# Patient Record
Sex: Female | Born: 1975 | Race: White | Hispanic: No | Marital: Married | Smoking: Never smoker
Health system: Southern US, Community
[De-identification: ages and names within clinical notes are randomized; demographics above are authoritative.]

## PROBLEM LIST (undated history)

## (undated) DIAGNOSIS — F32A Depression, unspecified: Secondary | ICD-10-CM

## (undated) DIAGNOSIS — F329 Major depressive disorder, single episode, unspecified: Secondary | ICD-10-CM

## (undated) DIAGNOSIS — Z0000 Encounter for general adult medical examination without abnormal findings: Principal | ICD-10-CM

## (undated) DIAGNOSIS — Z8619 Personal history of other infectious and parasitic diseases: Secondary | ICD-10-CM

## (undated) DIAGNOSIS — E559 Vitamin D deficiency, unspecified: Secondary | ICD-10-CM

## (undated) DIAGNOSIS — R51 Headache: Secondary | ICD-10-CM

## (undated) DIAGNOSIS — F419 Anxiety disorder, unspecified: Secondary | ICD-10-CM

## (undated) DIAGNOSIS — E039 Hypothyroidism, unspecified: Secondary | ICD-10-CM

## (undated) DIAGNOSIS — E049 Nontoxic goiter, unspecified: Secondary | ICD-10-CM

## (undated) DIAGNOSIS — B343 Parvovirus infection, unspecified: Secondary | ICD-10-CM

## (undated) DIAGNOSIS — K219 Gastro-esophageal reflux disease without esophagitis: Secondary | ICD-10-CM

## (undated) DIAGNOSIS — R251 Tremor, unspecified: Secondary | ICD-10-CM

## (undated) HISTORY — DX: Personal history of other infectious and parasitic diseases: Z86.19

## (undated) HISTORY — DX: Headache: R51

## (undated) HISTORY — DX: Depression, unspecified: F32.A

## (undated) HISTORY — DX: Tremor, unspecified: R25.1

## (undated) HISTORY — DX: Parvovirus infection, unspecified: B34.3

## (undated) HISTORY — DX: Nontoxic goiter, unspecified: E04.9

## (undated) HISTORY — DX: Major depressive disorder, single episode, unspecified: F32.9

## (undated) HISTORY — DX: Vitamin D deficiency, unspecified: E55.9

## (undated) HISTORY — DX: Hypothyroidism, unspecified: E03.9

## (undated) HISTORY — DX: Gastro-esophageal reflux disease without esophagitis: K21.9

## (undated) HISTORY — DX: Encounter for general adult medical examination without abnormal findings: Z00.00

---

## 2002-12-10 ENCOUNTER — Other Ambulatory Visit: Admission: RE | Admit: 2002-12-10 | Discharge: 2002-12-10 | Payer: Self-pay | Admitting: Obstetrics and Gynecology

## 2003-07-10 ENCOUNTER — Inpatient Hospital Stay (HOSPITAL_COMMUNITY): Admission: AD | Admit: 2003-07-10 | Discharge: 2003-07-14 | Payer: Self-pay | Admitting: Obstetrics and Gynecology

## 2003-07-10 ENCOUNTER — Inpatient Hospital Stay (HOSPITAL_COMMUNITY): Admission: AD | Admit: 2003-07-10 | Discharge: 2003-07-10 | Payer: Self-pay | Admitting: Obstetrics and Gynecology

## 2004-09-17 ENCOUNTER — Other Ambulatory Visit: Admission: RE | Admit: 2004-09-17 | Discharge: 2004-09-17 | Payer: Self-pay | Admitting: Obstetrics and Gynecology

## 2005-02-27 ENCOUNTER — Inpatient Hospital Stay (HOSPITAL_COMMUNITY): Admission: AD | Admit: 2005-02-27 | Discharge: 2005-02-27 | Payer: Self-pay | Admitting: Obstetrics & Gynecology

## 2005-06-03 ENCOUNTER — Inpatient Hospital Stay (HOSPITAL_COMMUNITY): Admission: RE | Admit: 2005-06-03 | Discharge: 2005-06-06 | Payer: Self-pay | Admitting: Obstetrics and Gynecology

## 2008-09-24 ENCOUNTER — Encounter (INDEPENDENT_AMBULATORY_CARE_PROVIDER_SITE_OTHER): Payer: Self-pay | Admitting: Obstetrics & Gynecology

## 2008-09-24 ENCOUNTER — Inpatient Hospital Stay (HOSPITAL_COMMUNITY): Admission: RE | Admit: 2008-09-24 | Discharge: 2008-09-27 | Payer: Self-pay | Admitting: Obstetrics & Gynecology

## 2009-10-01 ENCOUNTER — Encounter: Admission: RE | Admit: 2009-10-01 | Discharge: 2009-10-01 | Payer: Self-pay | Admitting: Family Medicine

## 2010-12-02 DIAGNOSIS — Z8619 Personal history of other infectious and parasitic diseases: Secondary | ICD-10-CM

## 2010-12-02 HISTORY — DX: Personal history of other infectious and parasitic diseases: Z86.19

## 2011-02-15 NOTE — Op Note (Signed)
NAMEGABRYELLA, [name]               ACCOUNT NO.:  [id number]   MEDICAL RECORD NO.:  [id number]          PATIENT TYPE:  INP   LOCATION:  9108                          FACILITY:  [facility]   PHYSICIAN:  Gerrit Friends. Aldona Bar, M.D.   DATE OF BIRTH:  12-20-1975   DATE OF PROCEDURE:  09/24/2008  DATE OF DISCHARGE:                               OPERATIVE REPORT   PREOPERATIVE DIAGNOSES:  39-week intrauterine pregnancy, previous  section x2, desire for permanent elective sterilization.   POSTOPERATIVE DIAGNOSES:  39-week intrauterine pregnancy, previous  section x2, desire for permanent elective sterilization plus delivery of  7 pounds 10 ounces female infant, Apgars 9 and 9 and pathology pending on  segments of each fallopian tube.   PROCEDURE:  Repeat low-transverse cesarean section and bilateral tubal  sterilization procedure.   SURGEON:  Gerrit Friends. Aldona Bar, MD   ASSISTANT:  Luvenia Redden, MD   ANESTHESIA:  Spinal - Dr. Pamalee Leyden.   HISTORY:  This 35 year old gravida 3, para 2 presents at 26 weeks'  gestation having had 2 previous cesarean sections.  She is now being  taken to the operating room for a repeat cesarean section and requested  permanent elective sterilization procedure as well.  She is aware that  such procedure is meant to be 100% permanent, but unfortunately it is  not 100% perfect as subsequent pregnancy can result.   PROCEDURE:  The patient was taken to the operating where after  satisfactory induction of spinal anesthetic by Dr. Pamalee Leyden, she was  prepped and draped having placed in the supine position slightly tilted  left.  Foley catheter was inserted for the prep.   Once the patient was adequately draped and good anesthetic levels were  documented, procedure was begun.   A Pfannenstiel incision was made through the old incision site and  dissected down sharply to and through the fascia in a low-transverse  fashion with hemostasis created at each layer.  Subfascial space was  created inferiorly and superiorly, muscles separated in the midline.  Peritoneum identified and entered appropriately with care taken to avoid  the bowel superiorly and the bladder inferiorly.  At this time, the  vesicouterine peritoneum was incised in a low-transverse fashion and  pushed off the lower uterine segment with ease.  Sharp incision into the  lower segment in the low transverse fashion was then carried out with  Metzenbaum scissors and extended laterally with the fingers.  With the  aid of the vacuum extractor, delivery of a viable female infant, which  cried spontaneously at once was carried out in vertex position without  difficulty.  There was a nuchal cord x1.   After the cord was clamped and cut, the infant was passed off to the  awaiting team and subsequently taken to nursery in good condition.  Apgar were assigned at 9 and 9 and subsequent weight was found to be 7  pounds 10 ounces.   After cord bloods were collected, the placenta was delivered intact.  The uterus was then exteriorized, rendered free of any remaining  products of conception, and good uterine contractility was  afforded, was  slowly given intravenous Pitocin and manual stimulation.  At this time,  the uterine incision was closed with a single layer of #1 Vicryl in a  running locking fashion and a figure-of-eight of #1 Vicryl was applied  for additional hemostasis.  Uterine incision was noted to be dry.  The  uterus at this time was well contracted.  Attention was turned to the  fallopian tube, which appeared normal as did both ovaries.  A Pomeroy  tubal sterilization procedure was carried out in usual fashion.  A  knuckle of the tube was created with a Babcock and a single tie of #1  plain was tied down about the knuckle, the knuckle was excised and sent  to Pathology.  This was carried out on both the right and left fallopian  tubes.  Both sites of tubal ligation were hemostatic and again the  uterine  incision was noted to be dry.  The uterus was replaced into the  abdominal incision after the abdomen lavaged of all free blood and clot  and no foreign bodies at this time were noted to be remaining in the  abdominal cavity and all counts were correct.  Again irrigation was  carried out and at this time closure of the abdomen was begun in layers.  The abdominal peritoneum was closed with 0-Vicryl in a running fashion.  Muscle secured with same.  Assured good subfascial hemostasis.  The  fascia was then reapproximated using 0-Vicryl from angle to midline  bilaterally.  Subcutaneous tissues were hemostatic and staples were then  used to close the skin.  A sterile pressure dressing was applied, and  the patient at this time was transported to recovery room in  satisfactory addition having tolerated the procedure well.  At the  conclusion of the procedure, both mother and baby were doing well in  their respective recovery areas.   In summary, this patient presented for repeat cesarean section and  desired for elective sterilization procedure.  Procedure was carried out  without difficulty and as mentioned, at conclusion of procedure both  mother and baby were doing well.      Gerrit Friends. Aldona Bar, M.D.  Electronically Signed     RMW/MEDQ  D:  09/24/2008  T:  09/24/2008  Job:  [id number]

## 2011-02-18 NOTE — Op Note (Signed)
[name], [name]               ACCOUNT NO.:  [id number]   MEDICAL RECORD NO.:  [id number]          PATIENT TYPE:  INP   LOCATION:  9109                          FACILITY:  [facility]   PHYSICIAN:  Miguel Aschoff, M.D.       DATE OF BIRTH:  [birth year]   DATE OF PROCEDURE:  06/03/2005  DATE OF DISCHARGE:                                 OPERATIVE REPORT   PREOPERATIVE DIAGNOSIS:  Intrauterine pregnancy at 39 weeks, for elective  repeat cesarean section.   POSTOPERATIVE DIAGNOSIS:  Intrauterine pregnancy at 39 weeks, for elective  repeat cesarean section.   PROCEDURE:  Repeat low flap transverse cesarean section.   SURGEON:  Miguel Aschoff, M.D.   ASSISTANT:  Carrington Clamp, M.D.   ANESTHESIA:  Spinal.   COMPLICATIONS:  None.   JUSTIFICATION:  The patient is a 35 year old white female, gravida 2, para 1-  0-0-1, with estimated date of confinement of June 09, 2005.  The patient  had previous cesarean section in 2004 for arrest of labor.  She presents now  to undergo to undergo elective repeat cesarean section with her being at  term.  Risks, benefits of the procedure were discussed with the patient.   PROCEDURE:  The patient was taken to the operating room, placed in sitting  position, and spinal anesthesia was administered without difficulty.  She  was then placed in the supine position, prepped and draped in the usual  sterile fashion.  She was deviated slightly to the left.  A Foley catheter  was inserted.  At this point a Pfannenstiel incision was made, extending  down through the subcutaneous tissue with bleeding points being clamped and  coagulated as they were encountered.  The fascia was then identified,  incised transversely.  It was then separated from the underlying rectus  muscles.  The rectus muscles were divided in the midline.  The peritoneum  was then found and entered, carefully underlying structures.  The bladder flap was then created and protected with the bladder  blade.  At  this point, an elliptical transverse incision was made into lower uterine  segment.  The amniotic cavity was entered, clear fluid was obtained, and  with the assistance of a vacuum extractor the patient was delivery of a  viable female infant, Apgar 8 at one minute and 9 at five minutes from a  vertex LOA position.  The baby weighed 8 pounds 7 ounces.  At this point  cord bloods were obtained for appropriate studies.  The placenta was then delivered.  The uterus was then evacuated of any  remaining products of conception.  The angles of the uterine incision were  ligated using figure-of-eight sutures of #1 Vicryl.  Then the uterus was closed in layers.  The first layer was a running  interlocking suture of #1 Vicryl, followed by an imbricating suture of #1  Vicryl.  The bladder flap was reapproximated using running continuous 2-0  Vicryl suture.  At this point lap counts were taken and found to be correct.  There was excellent hemostasis, and then the abdomen was closed.  The  parietal peritoneum was closed using running continuous 0 Vicryl suture.  Rectus muscles were reapproximated using running continuous 0 Vicryl suture.  The fascia was closed using two sutures of 0 Vicryl  starting at the lateral fascial angles and meeting in the midline.  Subcutaneous tissue and skin were closed using staples.  The estimated blood  loss for the procedure was approximately 500 mL.  The patient tolerated the  procedure well and went to the recovery room in satisfactory condition.      Miguel Aschoff, M.D.  Electronically Signed     AR/MEDQ  D:  06/03/2005  T:  06/03/2005  Job:  [id number]

## 2011-02-18 NOTE — Op Note (Signed)
[name], [name]                         ACCOUNT NO.:  [id number]   MEDICAL RECORD NO.:  [id number]                   PATIENT TYPE:  INP   LOCATION:  9165                                 [facility]ACILITY:  [facility]   PHYSICIAN:  Gerrit Friends. Aldona Bar, M.D.                DATE OF BIRTH:  Jun 06, 1976   DATE OF PROCEDURE:  07/11/2003  DATE OF DISCHARGE:                                 OPERATIVE REPORT   PREOPERATIVE DIAGNOSES:  1. Term pregnancy.  2. Suspected macrosomia.  3. Unengaged vertex in a primigravida at term in labor.   POSTOPERATIVE DIAGNOSES:  1. Term pregnancy.  2. Suspected macrosomia.  3. Unengaged vertex in a primigravida at term in labor.  4. Delivery of a 9 pound female infant, Apgars 9 and 9.   PROCEDURE:  Primary low transverse cesarean section.   SURGEON:  Gerrit Friends. Aldona Bar, M.D.   ANESTHESIA:  Spinal, Quillian Quince, M.D.   HISTORY:  This 35 year old primigravida at term was admitted on the evening  of October 7 in early labor after several visits to the hospital for  suspected labor.  On my evaluation on the morning of October 0, 2004, the  fundus measured 43 cm, fetal heart rate was reactive, cervix was 2 cm  dilated, 90% effaced, with the vertex at -3 and higher, ballottable vertex.  Membranes were intact.   To me the patient represented an unengaged vertex at term in a primigravida,  probably secondary to suspected macrosomia.  A discussion was carried out  with the patient and her husband as to method of delivery, cesarean section  versus anticipated vaginal delivery.  Risks and benefits were discussed and  a decision was made to proceed with a primary low transverse cesarean  section because of the possibility of a macrosomic baby and the potential of  a very difficult vaginal delivery should the patient progress to that point.  She was taken to the operating room now for said procedure.   Once in the operating room a spinal anesthetic was carried out by Dr.  Arby Barrette without difficulty, and thereafter the patient was prepped and  draped and a Foley catheter was inserted as part of the prep.   After the patient was adequately draped and good anesthetic levels were  documented, the procedure was begun.  A Pfannenstiel incision was made, with  minimal difficulty dissected down sharply to and through the fascia in a low  transverse fashion with hemostasis created at each layer.  The subfascial  space was created inferiorly and superiorly and muscles separated in the  midline and the peritoneum identified and entered appropriately with care  taken to avoid the bowel superiorly and the bladder inferiorly.  At this  time the vesicouterine peritoneum was identified, incised in a low  transverse fashion, pushed off the lower uterine segment with ease, and then  sharp incision into the uterus with the  Metzenbaum scissors was made and  extended laterally with fingers.  Amniotomy was produced, clear fluid.  Thereafter with minimal difficulty a viable female infant, which subsequently  was found to weigh 9 pounds, was delivered from a vertex position.  After  the cord was clamped and cut, the infant was passed off to the awaiting team  headed up by Dr. Alison Murray, and subsequently taken to the nursery in good  condition.  Apgars were noted to be 9 and 9.   After the placenta was delivered intact (cord bloods previously collected),  the uterus was exteriorized and rendered free of any remaining products of  conception.  Good uterine contractility was afforded with slowly-given  intravenous Pitocin and manual stimulation.  Closure of the uterine incision  at this time was carried out using #1 Vicryl in a running locking fashion.  This was reinforced with several figure-of-eight #1 Vicryls.  With good  uterine incision hemostasis noted, normal tubes and ovaries noted, a well-  contracted uterus observed, the uterus was replaced into the abdomen after  the abdomen  was lavaged of all free blood and clot.  All counts were noted  to be correct at this time and no foreign bodies were noted to remain in the  abdominal cavity.  Closure of the abdomen at this time was carried out in  layers.  The abdominal cavity was closed with 0 Vicryl in a running fashion  and the muscle secured with the same.  Assured of good subfascial  hemostasis, the fascia was then reapproximated using 0 Vicryl from angle to  midline bilaterally.  The subcutaneous tissue was then rendered hemostatic  and staples were then used to close the skin.  A sterile pressure dressing  was applied.  At this time the patient was transported to the recovery room,  having tolerated the procedure well.  Estimated blood loss 500 mL.  All  counts correct x2.   In summary, this patient was suspected to be carrying a baby that was large  and also represented an unengaged vertex at term in a primigravida at term  in early labor.  After discussion a decision was made to proceed with a  primary low transverse cesarean section.  The patient was delivered of a 9  pound female infant with Apgars of 9 and 9.  At the conclusion of the  procedure both mother and baby were doing well in their respective recovery  areas.                                                Gerrit Friends. Aldona Bar, M.D.    RMW/MEDQ  D:  07/11/2003  T:  07/11/2003  Job:  [id number]

## 2011-02-18 NOTE — Discharge Summary (Signed)
[name], [name]               ACCOUNT NO.:  [id number]   MEDICAL RECORD NO.:  [id number]          PATIENT TYPE:  INP   LOCATION:  9109                          FACILITY:  [facility]   PHYSICIAN:  Randye Lobo, M.D.   DATE OF BIRTH:  1976/06/09   DATE OF ADMISSION:  06/03/2005  DATE OF DISCHARGE:  06/06/2005                                 DISCHARGE SUMMARY   FINAL DIAGNOSES:  1.  Intrauterine pregnancy at term.  2.  History of previous cesarean section.  3.  Elective repeat cesarean section.   PROCEDURE:  Repeat low flap transverse cesarean section.   SURGEON:  Dr. Miguel Aschoff.   ASSISTANT:  Dr. Carrington Clamp.   COMPLICATIONS:  None.   HISTORY OF PRESENT ILLNESS:  This 35 year old G2, P 1-0-0-1 presents at term  for repeat cesarean section. The patient had a previous cesarean section in  2004 secondary to arrest of labor, and discussion was held with the patient  throughout her pregnancy, and she presents to undergo cesarean section at  term. The patient's antepartum course at this point had been uncomplicated.  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 She had a  negative group B strep culture obtained in the office at 36 weeks.  Otherwise, as I said, antepartum course was benign.   HOSPITAL COURSE:  She was taken to the operating room on June 03, 2005  by Dr. Miguel Aschoff where a repeat low flap transverse cesarean section was  performed with the delivery of an 8 pound 7 ounce female infant with Apgars  of 9/9.  Delivery was without complications. The patient's postoperative  course was benign without significant fevers. She was felt ready for  discharge on postoperative day #3.   DISCHARGE DIET:  She was sent home on a regular diet.   ACTIVITY:  She was told to decrease her activities.   DISCHARGE MEDICATIONS:  1.  She was told to continue her prenatal vitamins.  2.  She was to take an iron supplement daily.  3.  She given Darvocet N 100  one every 4 hours as needed for pain.  4.  She was told she could also use ibuprofen up to 600 milligrams every 6      hours as needed for pain.   FOLLOW UP:  She was to follow up in the office in 4 weeks.   LABORATORIES ON DISCHARGE:  The patient had a hemoglobin of 10.6, white  blood cell count of 10.4 and platelets of 117,000.      Leilani Able, P.A.-C.      Randye Lobo, M.D.  Electronically Signed    MB/MEDQ  D:  07/18/2005  T:  07/18/2005  Job:  [id number]

## 2011-02-18 NOTE — Discharge Summary (Signed)
[name], LECKER               ACCOUNT NO.:  [id number]   MEDICAL RECORD NO.:  [id number]          PATIENT TYPE:  INP   LOCATION:  9128                          [facility]ACILITY[facility]   PHYSICIAN:  Ilda Mori, M.D.   DATE OF BIRTH:  1976-01-27   DATE OF ADMISSION:  09/24/2008  DATE OF DISCHARGE:  09/27/2008                               DISCHARGE SUMMARY   FINAL DIAGNOSES:  Intrauterine pregnancy at 67 weeks' gestation, history  of previous cesarean sections x2, desires for permanent sterilization.   PROCEDURE:  Repeat low transverse cesarean section, bilateral tubal  sterilization procedure.   SURGEON:  Gerrit Friends. Aldona Bar, MD   ASSISTANT:  Luvenia Redden, MD   COMPLICATIONS:  None.   This 35 year old, G3, P2, presents at 1 weeks' gestation for repeat  cesarean section.  The patient had 2 prior cesarean sections and also  desires permanent sterilization to be performed after this third  cesarean section.  The patient's antepartum course up to this point had  been uncomplicated.  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  Otherwise, she had a negative group B strep  and an uncomplicated antepartum course.  The patient was taken to the  operating room on September 24, 2008, by Dr. Annamaria Helling where a repeat  low transverse cesarean section was performed with the delivery of a 7  pound 10 ounce female infant with Apgars of 9 and 9.  Delivery went  without complications.  At this point, the bilateral tubal ligation was  performed without complications as well.  The patient's postoperative  course and was benign without any significant fevers.  The patient was  felt ready for discharge on postoperative day #3.  She was sent home on  a regular diet, told to decrease activities, told to continue her  prenatal vitamins, was given a prescription for Darvocet-N 100 #30 one  to two every 4-6 hours as needed for pain, told she could use ibuprofen  up to 600 mg  every 6 hours as needed for pain, was to follow up in our  office in 4 weeks.  Instructions and precautions were reviewed with the  patient.   LABORATORY ON DISCHARGE:  The patient had a hemoglobin of 9.5, a white  blood cell count of 10.2, and platelets of 99,000.  Leilani Able, P.A.-C.      Ilda Mori, M.D.  Electronically Signed   MB/MEDQ  D:  10/21/2008  T:  10/21/2008  Job:  [id number]

## 2011-02-18 NOTE — Discharge Summary (Signed)
   [name], [name]                         ACCOUNT NO.:  [id number]   MEDICAL RECORD NO.:  [id number]                   PATIENT TYPE:  INP   LOCATION:  9116                                 [facility]ILITY[facility]   PHYSICIAN:  Ilda Mori, M.D.                DATE OF BIRTH:  April 26, 1976   DATE OF ADMISSION:  07/10/2003  DATE OF DISCHARGE:  07/14/2003                                 DISCHARGE SUMMARY   FINAL DIAGNOSES:  Term pregnancy delivered, suspected macrosomia, unengaged  vertex in a primigravida at term in labor.   SECONDARY DIAGNOSES:  None.   PROCEDURE:  Primary low transverse cesarean section.   COMPLICATIONS:  None.   CONDITION ON DISCHARGE:  Improved.   SUMMARY:  This is a 35 year old primigravida who was admitted on the evening  of July 10, 2003 in early labor after several visits to the hospital for  suspected labor.  She was evaluated by Dr. Aldona Bar who felt her cervix was 2 cm  dilated, 90% effaced, with a vertex at -3 station and ballottable, membranes  were intact.  Dr. Mechele Collin impression was in this clinical setting that  macrosomia was probably present and he discussed with the patient and her  husband the risks and benefits of trial of labor versus primary low  transverse cesarean section.  Decision was reached to proceed with a  cesarean section.  The patient was brought to the operating room by Dr. Aldona Bar  where a primary low transverse cesarean section was performed without  complication with delivery of a 9-pound female infant, Apgar scores 9 and 9.  The patient's postoperative course was benign without significant fever or  anemia.  On the third postoperative day the patient was felt to be ready for  discharge, she was afebrile, ambulating well and voiding without difficulty.  She was discharged on a regular diet, told to limit her activity, she was  given Tylox 30 tablets to take one to two every 4 hours for pain and asked  to take a prenatal vitamin.  She was  also asked to return to the office in 4  weeks for her followup evaluation.   LABORATORY DATA:  Her admission hemoglobin was 13.4 with a white count of  15.3.  On discharge her hemoglobin was 10.2 with a white count of 12.5.  Her  RPR was nonreactive.                                               Ilda Mori, M.D.    RK/MEDQ  D:  08/01/2003  T:  08/01/2003  Job:  [id number]

## 2011-03-04 DIAGNOSIS — B343 Parvovirus infection, unspecified: Secondary | ICD-10-CM

## 2011-03-04 HISTORY — DX: Parvovirus infection, unspecified: B34.3

## 2011-07-08 LAB — CBC
HCT: 28.2 % — ABNORMAL LOW (ref 36.0–46.0)
Hemoglobin: 9.5 g/dL — ABNORMAL LOW (ref 12.0–15.0)
MCV: 79.2 fL (ref 78.0–100.0)
Platelets: 151 10*3/uL (ref 150–400)
RBC: 4.58 MIL/uL (ref 3.87–5.11)
RDW: 15 % (ref 11.5–15.5)
WBC: 10.2 10*3/uL (ref 4.0–10.5)
WBC: 12.9 10*3/uL — ABNORMAL HIGH (ref 4.0–10.5)

## 2011-07-08 LAB — RPR: RPR Ser Ql: NONREACTIVE

## 2012-03-08 ENCOUNTER — Ambulatory Visit (INDEPENDENT_AMBULATORY_CARE_PROVIDER_SITE_OTHER): Payer: BC Managed Care – PPO | Admitting: Internal Medicine

## 2012-03-08 ENCOUNTER — Encounter: Payer: Self-pay | Admitting: Internal Medicine

## 2012-03-08 VITALS — BP 108/72 | HR 94 | Temp 98.3°F | Resp 18 | Ht 63.25 in | Wt 161.0 lb

## 2012-03-08 DIAGNOSIS — F341 Dysthymic disorder: Secondary | ICD-10-CM

## 2012-03-08 DIAGNOSIS — F418 Other specified anxiety disorders: Secondary | ICD-10-CM

## 2012-03-08 MED ORDER — BUPROPION HCL ER (XL) 150 MG PO TB24: 150.0000 mg | ORAL_TABLET | Freq: Every day | ORAL | Status: DC

## 2012-03-08 MED ORDER — ALPRAZOLAM 0.25 MG PO TABS: 0.2500 mg | ORAL_TABLET | Freq: Three times a day (TID) | ORAL | Status: DC | PRN

## 2012-03-11 DIAGNOSIS — F418 Other specified anxiety disorders: Secondary | ICD-10-CM | POA: Insufficient documentation

## 2012-03-11 NOTE — Assessment & Plan Note (Signed)
Change ssri to wellbutrin. rf xanax for prn sparing use. Understands potential for addiction/tolerance.

## 2012-03-11 NOTE — Progress Notes (Signed)
  Subjective:    Patient ID: [name], female    DOB: 1976/06/06, 36 y.o.   MRN: [id number]  HPI Pt presents to clinic for evaluation of depression. Suffers from mild depression. Has attempted both lexapro and prozac complicated by weight gain. Does use sparing prn xanax for mild anxiety. States labs utd including tsh. No alleviating or exacerbating factors. No other complaints.   Past Medical History  Diagnosis Date  . Depression     no counseling  . History of shingles 12/2010  . Parvovirus infection June 2012   Past Surgical History  Procedure Date  . Cesarean section 04, 06, 09    reports that she has never smoked. She has never used smokeless tobacco. She reports that she drinks alcohol. She reports that she does not use illicit drugs. family history includes Arthritis in her mother; Breast cancer in her maternal aunt; Colon cancer in her maternal aunt; Diabetes in her father; Hyperlipidemia in her father; Hypertension in her father; and Uterine cancer in her maternal aunt and maternal grandmother. No Known Allergies   Review of Systems  Psychiatric/Behavioral: Positive for dysphoric mood. Negative for suicidal ideas and agitation. The patient is nervous/anxious.   All other systems reviewed and are negative.       Objective:   Physical Exam  Nursing note and vitals reviewed. Constitutional: She appears well-developed and well-nourished. No distress.  HENT:  Head: Normocephalic and atraumatic.  Right Ear: External ear normal.  Left Ear: External ear normal.  Eyes: Conjunctivae and EOM are normal. Pupils are equal, round, and reactive to light. No scleral icterus.  Neck: Neck supple. No thyromegaly present.  Cardiovascular: Normal rate, regular rhythm and normal heart sounds.  Exam reveals no gallop and no friction rub.   No murmur heard. Pulmonary/Chest: Effort normal and breath sounds normal. No respiratory distress. She has no wheezes. She has no rales.    Lymphadenopathy:    She has no cervical adenopathy.  Neurological: She is alert.  Skin: Skin is warm and dry. She is not diaphoretic.  Psychiatric: She has a normal mood and affect.          Assessment & Plan:

## 2012-03-16 ENCOUNTER — Telehealth: Payer: Self-pay | Admitting: Internal Medicine

## 2012-03-16 NOTE — Telephone Encounter (Signed)
Received medical records from [facility] ° °P: [phone] °F: [phone] °

## 2012-04-26 ENCOUNTER — Ambulatory Visit (INDEPENDENT_AMBULATORY_CARE_PROVIDER_SITE_OTHER): Payer: BC Managed Care – PPO | Admitting: Internal Medicine

## 2012-04-26 ENCOUNTER — Encounter: Payer: Self-pay | Admitting: Internal Medicine

## 2012-04-26 VITALS — BP 118/72 | HR 87 | Temp 98.3°F | Resp 14 | Wt 155.5 lb

## 2012-04-26 DIAGNOSIS — R110 Nausea: Secondary | ICD-10-CM | POA: Insufficient documentation

## 2012-04-26 DIAGNOSIS — F418 Other specified anxiety disorders: Secondary | ICD-10-CM

## 2012-04-26 DIAGNOSIS — F341 Dysthymic disorder: Secondary | ICD-10-CM

## 2012-04-26 LAB — HEPATIC FUNCTION PANEL
AST: 12 U/L (ref 0–37)
Bilirubin, Direct: 0.1 mg/dL (ref 0.0–0.3)
Indirect Bilirubin: 0.4 mg/dL (ref 0.0–0.9)
Total Bilirubin: 0.5 mg/dL (ref 0.3–1.2)

## 2012-04-26 NOTE — Assessment & Plan Note (Signed)
Obtain lft and schedule abd [place].

## 2012-04-26 NOTE — Assessment & Plan Note (Signed)
Contolled. Continue wellbutrin.

## 2012-04-26 NOTE — Progress Notes (Signed)
  Subjective:    Patient ID: [name], female    DOB: [birth year], 36 y.o.   MRN: [id number]  HPI Pt presents to clinic for follow up of depression/anxiety. Previous treatment with prozac and lexapro leg to weight gain. Feels improved mood with wellbutrin. Notes only rare brief dizziness that is not burdensome. Has not required any xanax since beginning wellbutrin. Notes several week h/o intermittent nausea after eating fatty food. Denies emesis or abdominal pain. Now avoiding fatty food and sx's have resolved. Concerned over possible gallbladder etiology.   Past Medical History  Diagnosis Date  . Depression     no counseling  . History of shingles 12/2010  . Parvovirus infection June 2012   Past Surgical History  Procedure Date  . Cesarean section 04, 06, 09    reports that she has never smoked. She has never used smokeless tobacco. She reports that she drinks alcohol. She reports that she does not use illicit drugs. family history includes Arthritis in her mother; Breast cancer in her maternal aunt; Colon cancer in her maternal aunt; Diabetes in her father; Hyperlipidemia in her father; Hypertension in her father; and Uterine cancer in her maternal aunt and maternal grandmother. No Known Allergies   Review of Systems see hpi    Objective:   Physical Exam  Nursing note and vitals reviewed. Constitutional: She appears well-developed and well-nourished. No distress.  HENT:  Head: Normocephalic and atraumatic.  Neurological: She is alert.  Skin: She is not diaphoretic.  Psychiatric: She has a normal mood and affect. Her behavior is normal.          Assessment & Plan:

## 2012-04-26 NOTE — Patient Instructions (Signed)
Please schedule your abdominal ultrasound for when you are fasting

## 2012-04-27 ENCOUNTER — Ambulatory Visit (HOSPITAL_BASED_OUTPATIENT_CLINIC_OR_DEPARTMENT_OTHER)
Admission: RE | Admit: 2012-04-27 | Discharge: 2012-04-27 | Disposition: A | Discharge status: Home / Self Care | Payer: BC Managed Care – PPO | Source: Ambulatory Visit | Attending: Internal Medicine | Admitting: Internal Medicine

## 2012-04-27 DIAGNOSIS — R110 Nausea: Secondary | ICD-10-CM

## 2012-04-27 DIAGNOSIS — K8020 Calculus of gallbladder without cholecystitis without obstruction: Secondary | ICD-10-CM | POA: Insufficient documentation

## 2012-05-03 ENCOUNTER — Telehealth: Payer: Self-pay | Admitting: Internal Medicine

## 2012-05-03 MED ORDER — BUPROPION HCL ER (XL) 150 MG PO TB24: 150.0000 mg | ORAL_TABLET | Freq: Every day | ORAL | Status: DC

## 2012-05-03 NOTE — Telephone Encounter (Signed)
Replaced 06.06.13 Rx with 10-day supply at local pharmacy while awaiting new mail order Rx for Wellbutrin/[facility] Mail Order to new pharmacy done/[facility]

## 2012-05-03 NOTE — Telephone Encounter (Signed)
She needs her rx for bupropion 150xl mg 1 per day  Needs 30 day to [facility] mail order phone [phone] fax [phone] she also needs a 10 day to [facility] [address] in [place]

## 2012-10-17 ENCOUNTER — Ambulatory Visit (INDEPENDENT_AMBULATORY_CARE_PROVIDER_SITE_OTHER): Payer: BC Managed Care – PPO | Admitting: *Deleted

## 2012-10-17 DIAGNOSIS — Z23 Encounter for immunization: Secondary | ICD-10-CM

## 2012-12-26 ENCOUNTER — Ambulatory Visit (INDEPENDENT_AMBULATORY_CARE_PROVIDER_SITE_OTHER): Payer: BC Managed Care – PPO | Admitting: Family

## 2012-12-26 ENCOUNTER — Encounter: Payer: Self-pay | Admitting: Family

## 2012-12-26 VITALS — BP 118/80 | HR 105 | Temp 98.1°F | Resp 16 | Ht 63.25 in | Wt 163.0 lb

## 2012-12-26 DIAGNOSIS — J029 Acute pharyngitis, unspecified: Secondary | ICD-10-CM

## 2012-12-26 DIAGNOSIS — J069 Acute upper respiratory infection, unspecified: Secondary | ICD-10-CM | POA: Insufficient documentation

## 2012-12-26 DIAGNOSIS — F418 Other specified anxiety disorders: Secondary | ICD-10-CM

## 2012-12-26 DIAGNOSIS — F341 Dysthymic disorder: Secondary | ICD-10-CM

## 2012-12-26 DIAGNOSIS — R110 Nausea: Secondary | ICD-10-CM

## 2012-12-26 LAB — POCT RAPID STREP A (OFFICE): Rapid Strep A Screen: NEGATIVE

## 2012-12-26 MED ORDER — ALPRAZOLAM 0.25 MG PO TABS: 0.2500 mg | ORAL_TABLET | Freq: Three times a day (TID) | ORAL | Status: DC | PRN

## 2012-12-26 NOTE — Progress Notes (Signed)
  Subjective:    Patient ID: [name], female    DOB: 1976-02-09, 37 y.o.   MRN: [id number]  HPI  Reports symptoms started Sunday as a sore throat. Reports associated congestion and laryngitis.  She denies associated fever.  She reports family members have been sick.  Notes some white on her tonsil. She took contact cold last night without improvement in her symptoms.  The night before she took delsym which helped her cough.  Anxiety- reports rare use of alprazolam.  Uses twice a month.  She stopped the Wellbutrin.  Reports that she is doing well off of the wellbutrin.   Epigastric pain- notes that she often wakes up with abdominal pain in the AM. Spoke to her GYN who referred her to a surgeon for evaluation of possible chronic cholecystitis.  Review of Systems    see HPI  Past Medical History  Diagnosis Date  . Depression     no counseling  . History of shingles 12/2010  . Parvovirus infection June 2012    History   Social History  . Marital Status: Married    Spouse Name: N/A    Number of Children: N/A  . Years of Education: N/A   Occupational History  . Not on file.   Social History Main Topics  . Smoking status: Never Smoker   . Smokeless tobacco: Never Used  . Alcohol Use: Yes  . Drug Use: No  . Sexually Active: Not on file   Other Topics Concern  . Not on file   Social History Narrative  . No narrative on file    Past Surgical History  Procedure Laterality Date  . Cesarean section  04, 06, 09    Family History  Problem Relation Age of Onset  . Arthritis Mother   . Colon cancer Maternal Aunt   . Uterine cancer Maternal Aunt   . Breast cancer Maternal Aunt   . Uterine cancer Maternal Grandmother   . Hyperlipidemia Father   . Hypertension Father   . Diabetes Father     No Known Allergies  Current Outpatient Prescriptions on File Prior to Visit  Medication Sig Dispense Refill  . buPROPion (WELLBUTRIN XL) 150 MG 24 hr tablet Take 1 tablet (150  mg total) by mouth daily.  90 tablet  1   No current facility-administered medications on file prior to visit.    BP 118/80  Pulse 105  Temp(Src) 98.1 F (36.7 C) (Oral)  Resp 16  Ht 5' 3.25" (1.607 m)  Wt 163 lb (73.936 kg)  BMI 28.63 kg/m2  SpO2 99%  LMP 12/12/2012    Objective:   Physical Exam  Constitutional: She is oriented to person, place, and time. She appears well-developed and well-nourished. No distress.  HENT:  Head: Normocephalic and atraumatic.  Mouth/Throat: Posterior oropharyngeal erythema present. No oropharyngeal exudate, posterior oropharyngeal edema or tonsillar abscesses.  Cardiovascular: Normal rate and regular rhythm.   No murmur heard. Pulmonary/Chest: Effort normal and breath sounds normal. No respiratory distress. She has no wheezes. She has no rales. She exhibits no tenderness.  Musculoskeletal: She exhibits no edema.  Lymphadenopathy:    She has cervical adenopathy.  Neurological: She is alert and oriented to person, place, and time.  Skin: Skin is warm and dry.  Psychiatric: She has a normal mood and affect. Her behavior is normal. Thought content normal.          Assessment & Plan:

## 2012-12-26 NOTE — Assessment & Plan Note (Signed)
Rapid strep is negative. Recommended ibuprofen prn pain, call if symptoms worsen or if not improved in 3 days.

## 2012-12-26 NOTE — Assessment & Plan Note (Signed)
She has appointment with surgeon to evaluate cholelithiasis. This was arranged by GYN. She was given [place] report to bring to the surgeon.

## 2012-12-26 NOTE — Patient Instructions (Addendum)
Please call if symptoms worsen or if not improved in 3 days.  

## 2012-12-26 NOTE — Assessment & Plan Note (Signed)
Stable on prn xanax only.  Refill provided.

## 2014-02-17 ENCOUNTER — Ambulatory Visit (INDEPENDENT_AMBULATORY_CARE_PROVIDER_SITE_OTHER): Payer: 59 | Admitting: Nurse Practitioner

## 2014-02-17 ENCOUNTER — Encounter: Payer: Self-pay | Admitting: Nurse Practitioner

## 2014-02-17 ENCOUNTER — Ambulatory Visit (HOSPITAL_BASED_OUTPATIENT_CLINIC_OR_DEPARTMENT_OTHER)
Admission: RE | Admit: 2014-02-17 | Discharge: 2014-02-17 | Disposition: A | Discharge status: Home / Self Care | Payer: 59 | Source: Ambulatory Visit | Attending: Nurse Practitioner | Admitting: Nurse Practitioner

## 2014-02-17 VITALS — BP 116/83 | HR 82 | Temp 98.5°F | Resp 18 | Ht 64.0 in | Wt 158.0 lb

## 2014-02-17 DIAGNOSIS — E049 Nontoxic goiter, unspecified: Secondary | ICD-10-CM

## 2014-02-17 DIAGNOSIS — E041 Nontoxic single thyroid nodule: Secondary | ICD-10-CM | POA: Insufficient documentation

## 2014-02-17 DIAGNOSIS — J069 Acute upper respiratory infection, unspecified: Secondary | ICD-10-CM

## 2014-02-17 NOTE — Patient Instructions (Signed)
You have a cold virus causing your symptoms. The average duration of cold symptoms is 14 days. Start daily sinus rinses (neilmed Sinus Rinse). Use 30 mg to 60 mg pseudoephedrine twice daily. Use 400 mg ibuprophen twice daily for few days to relieve sinus pressure. Stop dayquil. Sip fluids every hour. Rest. If you are not feeling better in 1 week or develop fever or chest pain, call us for re-evaluation.  My office will call with lab & ultrasound results. Please schedule physical at your convenience. Return in Feel better!  Upper Respiratory Infection, Adult An upper respiratory infection (URI) is also sometimes known as the common cold. The upper respiratory tract includes the nose, sinuses, throat, trachea, and bronchi. Bronchi are the airways leading to the lungs. Most people improve within 1 week, but symptoms can last up to 2 weeks. A residual cough may last even longer.  CAUSES Many different viruses can infect the tissues lining the upper respiratory tract. The tissues become irritated and inflamed and often become very moist. Mucus production is also common. A cold is contagious. You can easily spread the virus to others by oral contact. This includes kissing, sharing a glass, coughing, or sneezing. Touching your mouth or nose and then touching a surface, which is then touched by another person, can also spread the virus. SYMPTOMS  Symptoms typically develop 1 to 3 days after you come in contact with a cold virus. Symptoms vary from person to person. They may include:  Runny nose.  Sneezing.  Nasal congestion.  Sinus irritation.  Sore throat.  Loss of voice (laryngitis).  Cough.  Fatigue.  Muscle aches.  Loss of appetite.  Headache.  Low-grade fever. DIAGNOSIS  You might diagnose your own cold based on familiar symptoms, since most people get a cold 2 to 3 times a year. Your caregiver can confirm this based on your exam. Most importantly, your caregiver can check that your  symptoms are not due to another disease such as strep throat, sinusitis, pneumonia, asthma, or epiglottitis. Blood tests, throat tests, and X-rays are not necessary to diagnose a common cold, but they may sometimes be helpful in excluding other more serious diseases. Your caregiver will decide if any further tests are required. RISKS AND COMPLICATIONS  You may be at risk for a more severe case of the common cold if you smoke cigarettes, have chronic heart disease (such as heart failure) or lung disease (such as asthma), or if you have a weakened immune system. The very young and very old are also at risk for more serious infections. Bacterial sinusitis, middle ear infections, and bacterial pneumonia can complicate the common cold. The common cold can worsen asthma and chronic obstructive pulmonary disease (COPD). Sometimes, these complications can require emergency medical care and may be life-threatening. PREVENTION  The best way to protect against getting a cold is to practice good hygiene. Avoid oral or hand contact with people with cold symptoms. Wash your hands often if contact occurs. There is no clear evidence that vitamin C, vitamin E, echinacea, or exercise reduces the chance of developing a cold. However, it is always recommended to get plenty of rest and practice good nutrition. TREATMENT  Treatment is directed at relieving symptoms. There is no cure. Antibiotics are not effective, because the infection is caused by a virus, not by bacteria. Treatment may include:  Increased fluid intake. Sports drinks offer valuable electrolytes, sugars, and fluids.  Breathing heated mist or steam (vaporizer or shower).  Eating chicken soup  or other clear broths, and maintaining good nutrition.  Getting plenty of rest.  Using gargles or lozenges for comfort.  Controlling fevers with ibuprofen or acetaminophen as directed by your caregiver.  Increasing usage of your inhaler if you have asthma. Zinc  gel and zinc lozenges, taken in the first 24 hours of the common cold, can shorten the duration and lessen the severity of symptoms. Pain medicines may help with fever, muscle aches, and throat pain. A variety of non-prescription medicines are available to treat congestion and runny nose. Your caregiver can make recommendations and may suggest nasal or lung inhalers for other symptoms.  HOME CARE INSTRUCTIONS   Only take over-the-counter or prescription medicines for pain, discomfort, or fever as directed by your caregiver.  Use a warm mist humidifier or inhale steam from a shower to increase air moisture. This may keep secretions moist and make it easier to breathe.  Drink enough water and fluids to keep your urine clear or pale yellow.  Rest as needed.  Return to work when your temperature has returned to normal or as your caregiver advises. You may need to stay home longer to avoid infecting others. You can also use a face mask and careful hand washing to prevent spread of the virus. SEEK MEDICAL CARE IF:   After the first few days, you feel you are getting worse rather than better.  You need your caregiver's advice about medicines to control symptoms.  You develop chills, worsening shortness of breath, or brown or red sputum. These may be signs of pneumonia.  You develop yellow or brown nasal discharge or pain in the face, especially when you bend forward. These may be signs of sinusitis.  You develop a fever, swollen neck glands, pain with swallowing, or white areas in the back of your throat. These may be signs of strep throat. SEEK IMMEDIATE MEDICAL CARE IF:   You have a fever.  You develop severe or persistent headache, ear pain, sinus pain, or chest pain.  You develop wheezing, a prolonged cough, cough up blood, or have a change in your usual mucus (if you have chronic lung disease).  You develop sore muscles or a stiff neck. Document Released: 03/15/2001 Document Revised:  12/12/2011 Document Reviewed: 01/21/2011 [facility]ExitCare Patient Information 2014 [place], [place].  Goiter Goiter is an enlarged thyroid gland. The thyroid gland sits at the base of the front of the neck. The gland produces hormones that regulate mood, body temperature, pulse rate, and digestion. Most goiters are painless and are not a cause for serious concern. Goiters and conditions that cause goiters can be treated if necessary.  CAUSES  Common causes of goiter include:  Graves disease (causes too much hormone to be produced [hyperthyroidism]).  Hashimoto's disease (causes too little hormone to be produced [hypothyroidism]).  Thyroiditis (inflammation of the thyroid sometimes caused by virus or pregnancy).  Nodular goiter (small bumps form; sometimes called toxic nodular goiter).  Pregnancy.  Thyroid cancer (very few goiters with nodules are cancerous).  Certain medications.  Radiation exposure.  Iodine deficiency (more common in developing countries in inland populations). RISK FACTORS Risk factors for goiter include:  A family history of goiter.  Female gender.  Inadequate iodine in the diet.  Age older than 40 years. SYMPTOMS  Many goiters do not cause symptoms. When symptoms do occur, they may include:  Swelling in the lower part of the neck. This swelling can range from a very small bump to a large lump.  A tight  feeling in the throat.  A hoarse voice. Less commonly, a goiter may result in:  Coughing.  Wheezing.  Difficulty swallowing.  Difficulty breathing.  Bulging neck veins.  Dizziness. When a goiter is the result of hyperthyroidism, symptoms may include:  Rapid or irregular heart beat.  Sicknessin your stomach (nausea).  Vomiting.  Diarrhea.  Shaking.  Irritable feeling.  Bulging eyes.  Weight loss.  Heat sensitivity.  Anxiety. When a goiter is the result of hypothyroidism, symptoms may include:  Tiredness.  Dry  skin.  Constipation.  Weight gain.  Irregular menstrual cycle.  Depressed mood.  Sensitivity to cold. DIAGNOSIS  Tests used to diagnose goiter include:  A physical exam.  Blood tests, including thyroid hormone levels and antibody testing.  Ultrasonography, computerized X-ray scan (computed tomography, CT) or computerized magnetic scan (magnetic resonance imaging, MRI).  Thyroid scan (imaging along with safe radioactive injection).  Tissue sample taken (biopsy) of nodules. This is sometimes done to confirm that the nodules are not cancerous. TREATMENT  Treatment will depend on the cause of the goiter. Treatment may include:  Monitoring. In some cases, no treatment is necessary, and your doctor will monitor yourcondition at regular check ups.  Medications and supplements. Thyroid medication (thyroid hormone replacement) is available for hyperthroidism and hypothyroidism.  If inflammation is the cause, over-the-counter medication or steroid medication may be recommended.  Goiters caused by iodine deficiency can be treated with iodine supplements or changes in diet.  Radioactive iodine treatment. Radioactive iodine is injected into the blood. It travels to the thyroid gland, kills thyroid cells, and reduces the size of the gland. This is only used when the thyroid gland is overactive. Lifelong thyroid hormone medication is often necessary after this treatment.  Surgery. A procedure to remove all or part of the gland may be recommended in severe cases or when cancer is the cause. Hormones can be taken to replace the hormones normally produced by the thyroid. HOME CARE INSTRUCTIONS   Take medications as directed.  Follow your caregiver's recommendations for any dietary changes.  Follow up with your caregiver for further examination and testing, as directed. PREVENTION   If you have a family history of goiter, discuss screening with your doctor.  Make sure you are getting  enough iodine in your diet.  Use of iodized table salt can help prevent iodine deficiency. Document Released: 03/09/2010 Document Revised: 12/12/2011 Document Reviewed: 03/09/2010 [facility] Patient Information 2014 [place]Care, [place].

## 2014-02-17 NOTE — Progress Notes (Signed)
   Subjective:    Patient ID: [name], female    DOB: 04/26/1976, 38 y.o.   MRN: [id number]  URI  This is a new problem. The current episode started 1 to 4 weeks ago (1wk). The problem has been gradually improving. The maximum temperature recorded prior to her arrival was 100 - 100.9 F. The fever has been present for 3 to 4 days. Associated symptoms include congestion, coughing, diarrhea (X3), ear pain, headaches, a plugged ear sensation, sinus pain, a sore throat and swollen glands. Pertinent negatives include no abdominal pain, chest pain, nausea, vomiting or wheezing. Treatments tried: dayquil. The treatment provided no relief.      Review of Systems  Constitutional: Positive for fever (resolved), chills and fatigue. Negative for activity change and appetite change.  HENT: Positive for congestion, ear pain, postnasal drip, sinus pressure, sore throat and voice change.   Respiratory: Positive for cough. Negative for chest tightness, shortness of breath and wheezing.   Cardiovascular: Negative for chest pain.  Gastrointestinal: Positive for diarrhea (X3). Negative for nausea, vomiting and abdominal pain.  Neurological: Positive for headaches.       Objective:   Physical Exam  Vitals reviewed. Constitutional: She is oriented to person, place, and time. She appears well-developed and well-nourished. No distress.  HENT:  Head: Normocephalic and atraumatic.  Right Ear: External ear normal.  Left Ear: External ear normal.  Mouth/Throat: Oropharynx is clear and moist. No oropharyngeal exudate.  L TM vessels mildly injected. Bones visible.  Eyes: Conjunctivae are normal. Right eye exhibits no discharge. Left eye exhibits no discharge.  Neck: Normal range of motion. Neck supple. Thyromegaly present.  Cardiovascular: Normal rate, regular rhythm and normal heart sounds.   No murmur heard. Pulmonary/Chest: Effort normal and breath sounds normal. No respiratory distress. She has no  wheezes. She has no rales.  Lymphadenopathy:    She has cervical adenopathy.  Neurological: She is alert and oriented to person, place, and time.  Skin: Skin is warm and dry.  Psychiatric: She has a normal mood and affect. Her behavior is normal. Thought content normal.          Assessment & Plan:  1. Enlarged thyroid gland L lobe >R - T4, free - Thyroid peroxidase antibody - TSH - Comprehensive metabolic panel - CBC - US Soft Tissue Head/Neck; Future  2 URI Sinus rinses, pseudoephedrine

## 2014-02-18 ENCOUNTER — Telehealth: Payer: Self-pay | Admitting: *Deleted

## 2014-02-18 LAB — CBC
HCT: 34.6 % — ABNORMAL LOW (ref 36.0–46.0)
Hemoglobin: 11.4 g/dL — ABNORMAL LOW (ref 12.0–15.0)
MCHC: 33 g/dL (ref 30.0–36.0)
MCV: 79.3 fl (ref 78.0–100.0)
PLATELETS: 258 10*3/uL (ref 150.0–400.0)
RBC: 4.36 Mil/uL (ref 3.87–5.11)
RDW: 14.4 % (ref 11.5–15.5)
WBC: 6.5 10*3/uL (ref 4.0–10.5)

## 2014-02-18 LAB — COMPREHENSIVE METABOLIC PANEL
ALT: 27 U/L (ref 0–35)
AST: 15 U/L (ref 0–37)
Albumin: 4 g/dL (ref 3.5–5.2)
Alkaline Phosphatase: 69 U/L (ref 39–117)
BUN: 9 mg/dL (ref 6–23)
CALCIUM: 9.1 mg/dL (ref 8.4–10.5)
CHLORIDE: 103 meq/L (ref 96–112)
CO2: 28 mEq/L (ref 19–32)
CREATININE: 0.6 mg/dL (ref 0.4–1.2)
GFR: 113.28 mL/min (ref >60.00)
GLUCOSE: 111 mg/dL — AB (ref 70–99)
Potassium: 4.2 mEq/L (ref 3.5–5.1)
Sodium: 138 mEq/L (ref 135–145)
Total Bilirubin: 0.1 mg/dL — ABNORMAL LOW (ref 0.2–1.2)
Total Protein: 7 g/dL (ref 6.0–8.3)

## 2014-02-18 LAB — TSH: TSH: 1 u[IU]/mL (ref 0.35–4.50)

## 2014-02-18 LAB — T4, FREE: Free T4: 0.75 ng/dL (ref 0.60–1.60)

## 2014-02-18 LAB — THYROID PEROXIDASE ANTIBODY

## 2014-02-18 NOTE — Telephone Encounter (Signed)
LMOVM for pt to return. 

## 2014-02-18 NOTE — Telephone Encounter (Signed)
pls call pt: Advise Good news-no thyroid nodules. Still waiting on all thyroid labs. Will call when all labs result.

## 2014-02-18 NOTE — Telephone Encounter (Signed)
Patient called office requesting US results.

## 2014-02-18 NOTE — Telephone Encounter (Signed)
Thyroid labs nml. She is mildly anemic. Ask if heavy periods. All other labs good. Present illness is viral. Monitor thyroid yearly.

## 2014-02-19 NOTE — Telephone Encounter (Signed)
Pt returned called and was given results. Pt stated that her period had just ended on the day she had labs drawn and her periods tend to be normal. Pt wanted you to know she stopped eating red meat 2 months ago and wondered if this could cause anemia? Pt also wanted to know if the US measures the size of the thyroid?

## 2014-02-19 NOTE — Telephone Encounter (Signed)
LMOVM for pt to return call concerning results. 

## 2014-02-19 NOTE — Telephone Encounter (Signed)
Please send her a copy of US report. Red meat is not necessary for red blood cell production.

## 2014-02-21 NOTE — Telephone Encounter (Signed)
LMOVM for pt to return call. [place] results were mailed to pt's home address.

## 2014-02-21 NOTE — Telephone Encounter (Signed)
Patient notified. Pt also spoke to [facility] about results.

## 2014-05-01 ENCOUNTER — Emergency Department (INDEPENDENT_AMBULATORY_CARE_PROVIDER_SITE_OTHER): Payer: 59

## 2014-05-01 ENCOUNTER — Emergency Department (HOSPITAL_COMMUNITY)
Admission: EM | Admit: 2014-05-01 | Discharge: 2014-05-01 | Disposition: A | Discharge status: Home / Self Care | Payer: 59 | Source: Home / Self Care | Attending: Family Medicine | Admitting: Family Medicine

## 2014-05-01 ENCOUNTER — Encounter (HOSPITAL_COMMUNITY): Payer: Self-pay | Admitting: Emergency Medicine

## 2014-05-01 DIAGNOSIS — M1310 Monoarthritis, not elsewhere classified, unspecified site: Secondary | ICD-10-CM

## 2014-05-01 LAB — CBC WITH DIFFERENTIAL/PLATELET
Basophils Absolute: 0 10*3/uL (ref 0.0–0.1)
Basophils Relative: 0 % (ref 0–1)
EOS ABS: 0 10*3/uL (ref 0.0–0.7)
EOS PCT: 0 % (ref 0–5)
HEMATOCRIT: 38.3 % (ref 36.0–46.0)
HEMOGLOBIN: 12.5 g/dL (ref 12.0–15.0)
LYMPHS ABS: 2 10*3/uL (ref 0.7–4.0)
LYMPHS PCT: 22 % (ref 12–46)
MCH: 26.8 pg (ref 26.0–34.0)
MCHC: 32.6 g/dL (ref 30.0–36.0)
MCV: 82 fL (ref 78.0–100.0)
MONO ABS: 0.5 10*3/uL (ref 0.1–1.0)
MONOS PCT: 5 % (ref 3–12)
Neutro Abs: 6.6 10*3/uL (ref 1.7–7.7)
Neutrophils Relative %: 73 % (ref 43–77)
Platelets: 231 10*3/uL (ref 150–400)
RBC: 4.67 MIL/uL (ref 3.87–5.11)
RDW: 13.8 % (ref 11.5–15.5)
WBC: 9.1 10*3/uL (ref 4.0–10.5)

## 2014-05-01 LAB — POCT I-STAT, CHEM 8
BUN: 12 mg/dL (ref 6–23)
CALCIUM ION: 1.16 mmol/L (ref 1.12–1.23)
CHLORIDE: 105 meq/L (ref 96–112)
Creatinine, Ser: 0.9 mg/dL (ref 0.50–1.10)
GLUCOSE: 102 mg/dL — AB (ref 70–99)
HCT: 40 % (ref 36.0–46.0)
Hemoglobin: 13.6 g/dL (ref 12.0–15.0)
POTASSIUM: 4.5 meq/L (ref 3.7–5.3)
Sodium: 139 mEq/L (ref 137–147)
TCO2: 25 mmol/L (ref 0–100)

## 2014-05-01 LAB — SEDIMENTATION RATE: Sed Rate: 6 mm/hr (ref 0–22)

## 2014-05-01 LAB — URIC ACID: Uric Acid, Serum: 4.7 mg/dL (ref 2.4–7.0)

## 2014-05-01 MED ORDER — PREDNISONE 20 MG PO TABS
ORAL_TABLET | ORAL | Status: AC
  Filled 2014-05-01: qty 3

## 2014-05-01 MED ORDER — PREDNISONE 10 MG PO TABS: ORAL_TABLET | ORAL | Status: DC

## 2014-05-01 MED ORDER — PREDNISONE 20 MG PO TABS
60.0000 mg | ORAL_TABLET | Freq: Once | ORAL | Status: AC
  Administered 2014-05-01: 60 mg via ORAL

## 2014-05-01 NOTE — ED Provider Notes (Signed)
CSN: [id number]     Arrival date & time 05/01/14  1607 History   None    Chief Complaint  Patient presents with  . Hand Pain   (Consider location/radiation/quality/duration/timing/severity/associated sxs/prior Treatment) HPI Comments: 38 year old female presents for evaluation of pain and swelling in her right index finger MCP. Starting yesterday, she had acute onset of pain in the right index MCP, pain with range of motion, erythema. There is swelling in the finger distal to this as well. She denies any known injury and has never had this before. She has no history of inflammatory arthritis or gout. No known scratches or cuts in the skin. No risk for STDs. No history of HIV or immune compromise. She is concerned about possible septic arthritis after doing research on the Internet. She admits to having arthritis in her hands once in the past when she was diagnosed with adult fifths disease 3 years ago. No systemic symptoms.  Patient is a 38 y.o. female presenting with hand pain.  Hand Pain    Past Medical History  Diagnosis Date  . Depression     no counseling  . History of shingles 12/2010  . Parvovirus infection June 2012   Past Surgical History  Procedure Laterality Date  . Cesarean section  04, 06, 09   Family History  Problem Relation Age of Onset  . Arthritis Mother   . Colon cancer Maternal Aunt   . Uterine cancer Maternal Aunt   . Breast cancer Maternal Aunt   . Uterine cancer Maternal Grandmother   . Hyperlipidemia Father   . Hypertension Father   . Diabetes Father    History  Substance Use Topics  . Smoking status: Never Smoker   . Smokeless tobacco: Never Used  . Alcohol Use: Yes   OB History   Grav Para Term Preterm Abortions TAB SAB Ect Mult Living                 Review of Systems  Musculoskeletal: Positive for arthralgias and joint swelling.  All other systems reviewed and are negative.   Allergies  Review of patient's allergies indicates no known  allergies.  Home Medications   Prior to Admission medications   Medication Sig Start Date End Date Taking? Authorizing Provider  ALPRAZolam (XANAX) 0.25 MG tablet Take 1 tablet (0.25 mg total) by mouth 3 (three) times daily as needed for anxiety. 12/26/12   Sandford Craze, NP  levonorgestrel-ethinyl estradiol (AVIANE) 0.1-20 MG-MCG tablet Take 1 tablet by mouth daily.    Historical Provider, MD  predniSONE (DELTASONE) 10 MG tablet 4 tabs PO QD for 4 days; 3 tabs PO QD for 3 days; 2 tabs PO QD for 2 days; 1 tab PO QD for 1 day 05/01/14   Graylon Good, PA-C  ranitidine (ZANTAC) 150 MG capsule Take 150 mg by mouth 2 (two) times daily.    Historical Provider, MD   BP 122/80  Pulse 81  Temp(Src) 98.4 F (36.9 C) (Oral)  Resp 18  SpO2 100%  LMP 05/01/2014 Physical Exam  Nursing note and vitals reviewed. Constitutional: She is oriented to person, place, and time. Vital signs are normal. She appears well-developed and well-nourished. No distress.  HENT:  Head: Normocephalic and atraumatic.  Pulmonary/Chest: Effort normal. No respiratory distress.  Musculoskeletal:       Right hand: She exhibits decreased range of motion (Index finger MCP only), tenderness and swelling (Over the index MCP and index finger, with TTP, skin is erythematous ). She  exhibits normal capillary refill and no deformity.  Neurological: She is alert and oriented to person, place, and time. She has normal strength. Coordination normal.  Skin: Skin is warm and dry. No rash noted. She is not diaphoretic.  Psychiatric: She has a normal mood and affect. Judgment normal.    ED Course  Procedures (including critical care time) Labs Review Labs Reviewed  POCT I-STAT, CHEM 8 - Abnormal; Notable for the following:    Glucose, Bld 102 (*)    All other components within normal limits  CBC WITH DIFFERENTIAL  SEDIMENTATION RATE  URIC ACID    Imaging Review Dg Hand Complete Right  05/01/2014   CLINICAL DATA:  Right  hand pain began yesterday. Pain in the metacarpophalangeal region. No known injury.  EXAM: RIGHT HAND - COMPLETE 3+ VIEW  COMPARISON:  None.  FINDINGS: There is no evidence of fracture or dislocation. There is no evidence of arthropathy or other focal bone abnormality. Soft tissues are unremarkable.  IMPRESSION: Negative.   Electronically Signed   By: Rosalie GumsBeth  Brown M.D.   On: 05/01/2014 17:16     MDM   1. Acute monoarthritis    Differential includes inflammatory arthritis, osteoarthritis, septic arthritis, cellulitis, relapsing monoarticular inflammatory arthritis due to adult onset parvovirus B19 infection diagnosed 3 years ago.  Case discussed with attending.  CBC, uric acid, sed rate all WNL.  Treat with prednisone for probably inflammatory arthritis, also splinted finger.    Meds ordered this encounter  Medications  . predniSONE (DELTASONE) tablet 60 mg    Sig:   . predniSONE (DELTASONE) 10 MG tablet    Sig: 4 tabs PO QD for 4 days; 3 tabs PO QD for 3 days; 2 tabs PO QD for 2 days; 1 tab PO QD for 1 day    Dispense:  30 tablet    Refill:  0    Order Specific Question:  Supervising Provider    Answer:  Clementeen GrahamOREY, EVAN, Kathie RhodesS [[id number]]     Graylon GoodZachary H Sire Poet, PA-C 05/01/14 1936

## 2014-05-01 NOTE — ED Notes (Signed)
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

## 2014-05-02 ENCOUNTER — Ambulatory Visit: Payer: 59 | Admitting: Nurse Practitioner

## 2014-05-02 NOTE — ED Provider Notes (Signed)
Agree.  Joint is more likely rheumatologic or osteoarthritic than infectious. Medical screening examination/treatment/procedure(s) were performed by a resident physician or non-physician practitioner and as the supervising physician I was immediately available for consultation/collaboration.  Kynan Peasley, MD    RodolphClementeen Graham BongEvan S Sire Poet, MD 05/02/14 [phone]

## 2014-07-18 ENCOUNTER — Other Ambulatory Visit: Payer: Self-pay

## 2015-08-21 ENCOUNTER — Ambulatory Visit: Payer: Self-pay | Admitting: Family Medicine

## 2015-08-24 ENCOUNTER — Ambulatory Visit (INDEPENDENT_AMBULATORY_CARE_PROVIDER_SITE_OTHER): Payer: 59 | Admitting: Family Medicine

## 2015-08-24 ENCOUNTER — Encounter: Payer: Self-pay | Admitting: Family Medicine

## 2015-08-24 VITALS — BP 112/78 | HR 86 | Temp 98.5°F | Ht 63.0 in | Wt 167.0 lb

## 2015-08-24 DIAGNOSIS — F418 Other specified anxiety disorders: Secondary | ICD-10-CM

## 2015-08-24 DIAGNOSIS — R251 Tremor, unspecified: Secondary | ICD-10-CM

## 2015-08-24 DIAGNOSIS — Z8619 Personal history of other infectious and parasitic diseases: Secondary | ICD-10-CM

## 2015-08-24 DIAGNOSIS — K219 Gastro-esophageal reflux disease without esophagitis: Secondary | ICD-10-CM | POA: Diagnosis not present

## 2015-08-24 DIAGNOSIS — M62838 Other muscle spasm: Secondary | ICD-10-CM | POA: Diagnosis not present

## 2015-08-24 DIAGNOSIS — E559 Vitamin D deficiency, unspecified: Secondary | ICD-10-CM | POA: Insufficient documentation

## 2015-08-24 DIAGNOSIS — M542 Cervicalgia: Secondary | ICD-10-CM

## 2015-08-24 DIAGNOSIS — E049 Nontoxic goiter, unspecified: Secondary | ICD-10-CM

## 2015-08-24 HISTORY — DX: Personal history of other infectious and parasitic diseases: Z86.19

## 2015-08-24 HISTORY — DX: Vitamin D deficiency, unspecified: E55.9

## 2015-08-24 HISTORY — DX: Gastro-esophageal reflux disease without esophagitis: K21.9

## 2015-08-24 LAB — VITAMIN D 25 HYDROXY (VIT D DEFICIENCY, FRACTURES): VITD: 20.1 ng/mL — ABNORMAL LOW (ref 30.00–100.00)

## 2015-08-24 LAB — MAGNESIUM: Magnesium: 2 mg/dL (ref 1.5–2.5)

## 2015-08-24 NOTE — Assessment & Plan Note (Addendum)
H/o, diet controlled, encouraged addition of probiotic

## 2015-08-24 NOTE — Progress Notes (Signed)
Pre visit review using our clinic review tool, if applicable. No additional management support is needed unless otherwise documented below in the visit note. 

## 2015-08-24 NOTE — Patient Instructions (Addendum)
Digestive Advantage gummies or a capsule daily Vitamin D 2000 IU daily  Salon pas gel  64 0z of clear fluids daily Small, frequent meals, 1 complex carb per meal, always with a lean protein, eat roughly every 4-5 hours  Preventive Care for Adults, Female A healthy lifestyle and preventive care can promote health and wellness. Preventive health guidelines for women include the following key practices.  A routine yearly physical is a good way to check with your health care provider about your health and preventive screening. It is a chance to share any concerns and updates on your health and to receive a thorough exam.  Visit your dentist for a routine exam and preventive care every 6 months. Brush your teeth twice a day and floss once a day. Good oral hygiene prevents tooth decay and gum disease.  The frequency of eye exams is based on your age, health, family medical history, use of contact lenses, and other factors. Follow your health care provider's recommendations for frequency of eye exams.  Eat a healthy diet. Foods like vegetables, fruits, whole grains, low-fat dairy products, and lean protein foods contain the nutrients you need without too many calories. Decrease your intake of foods high in solid fats, added sugars, and salt. Eat the right amount of calories for you.Get information about a proper diet from your health care provider, if necessary.  Regular physical exercise is one of the most important things you can do for your health. Most adults should get at least 150 minutes of moderate-intensity exercise (any activity that increases your heart rate and causes you to sweat) each week. In addition, most adults need muscle-strengthening exercises on 2 or more days a week.  Maintain a healthy weight. The body mass index (BMI) is a screening tool to identify possible weight problems. It provides an estimate of body fat based on height and weight. Your health care provider can find your  BMI and can help you achieve or maintain a healthy weight.For adults 20 years and older:  A BMI below 18.5 is considered underweight.  A BMI of 18.5 to 24.9 is normal.  A BMI of 25 to 29.9 is considered overweight.  A BMI of 30 and above is considered obese.  Maintain normal blood lipids and cholesterol levels by exercising and minimizing your intake of saturated fat. Eat a balanced diet with plenty of fruit and vegetables. Blood tests for lipids and cholesterol should begin at age 61 and be repeated every 5 years. If your lipid or cholesterol levels are high, you are over 50, or you are at high risk for heart disease, you may need your cholesterol levels checked more frequently.Ongoing high lipid and cholesterol levels should be treated with medicines if diet and exercise are not working.  If you smoke, find out from your health care provider how to quit. If you do not use tobacco, do not start.  Lung cancer screening is recommended for adults aged 27-80 years who are at high risk for developing lung cancer because of a history of smoking. A yearly low-dose CT scan of the lungs is recommended for people who have at least a 30-pack-year history of smoking and are a current smoker or have quit within the past 15 years. A pack year of smoking is smoking an average of 1 pack of cigarettes a day for 1 year (for example: 1 pack a day for 30 years or 2 packs a day for 15 years). Yearly screening should continue until the  smoker has stopped smoking for at least 15 years. Yearly screening should be stopped for people who develop a health problem that would prevent them from having lung cancer treatment.  If you are pregnant, do not drink alcohol. If you are breastfeeding, be very cautious about drinking alcohol. If you are not pregnant and choose to drink alcohol, do not have more than 1 drink per day. One drink is considered to be 12 ounces (355 mL) of beer, 5 ounces (148 mL) of wine, or 1.5 ounces (44  mL) of liquor.  Avoid use of street drugs. Do not share needles with anyone. Ask for help if you need support or instructions about stopping the use of drugs.  High blood pressure causes heart disease and increases the risk of stroke. Your blood pressure should be checked at least every 1 to 2 years. Ongoing high blood pressure should be treated with medicines if weight loss and exercise do not work.  If you are 50-43 years old, ask your health care provider if you should take aspirin to prevent strokes.  Diabetes screening is done by taking a blood sample to check your blood glucose level after you have not eaten for a certain period of time (fasting). If you are not overweight and you do not have risk factors for diabetes, you should be screened once every 3 years starting at age 28. If you are overweight or obese and you are 69-71 years of age, you should be screened for diabetes every year as part of your cardiovascular risk assessment.  Breast cancer screening is essential preventive care for women. You should practice "breast self-awareness." This means understanding the normal appearance and feel of your breasts and may include breast self-examination. Any changes detected, no matter how small, should be reported to a health care provider. Women in their 17s and 30s should have a clinical breast exam (CBE) by a health care provider as part of a regular health exam every 1 to 3 years. After age 42, women should have a CBE every year. Starting at age 61, women should consider having a mammogram (breast X-ray test) every year. Women who have a family history of breast cancer should talk to their health care provider about genetic screening. Women at a high risk of breast cancer should talk to their health care providers about having an MRI and a mammogram every year.  Breast cancer gene (BRCA)-related cancer risk assessment is recommended for women who have family members with BRCA-related cancers.  BRCA-related cancers include breast, ovarian, tubal, and peritoneal cancers. Having family members with these cancers may be associated with an increased risk for harmful changes (mutations) in the breast cancer genes BRCA1 and BRCA2. Results of the assessment will determine the need for genetic counseling and BRCA1 and BRCA2 testing.  Your health care provider may recommend that you be screened regularly for cancer of the pelvic organs (ovaries, uterus, and vagina). This screening involves a pelvic examination, including checking for microscopic changes to the surface of your cervix (Pap test). You may be encouraged to have this screening done every 3 years, beginning at age 55.  For women ages 60-65, health care providers may recommend pelvic exams and Pap testing every 3 years, or they may recommend the Pap and pelvic exam, combined with testing for human papilloma virus (HPV), every 5 years. Some types of HPV increase your risk of cervical cancer. Testing for HPV may also be done on women of any age with unclear Pap  test results.  Other health care providers may not recommend any screening for nonpregnant women who are considered low risk for pelvic cancer and who do not have symptoms. Ask your health care provider if a screening pelvic exam is right for you.  If you have had past treatment for cervical cancer or a condition that could lead to cancer, you need Pap tests and screening for cancer for at least 20 years after your treatment. If Pap tests have been discontinued, your risk factors (such as having a new sexual partner) need to be reassessed to determine if screening should resume. Some women have medical problems that increase the chance of getting cervical cancer. In these cases, your health care provider may recommend more frequent screening and Pap tests.  Colorectal cancer can be detected and often prevented. Most routine colorectal cancer screening begins at the age of 53 years and  continues through age 73 years. However, your health care provider may recommend screening at an earlier age if you have risk factors for colon cancer. On a yearly basis, your health care provider may provide home test kits to check for hidden blood in the stool. Use of a small camera at the end of a tube, to directly examine the colon (sigmoidoscopy or colonoscopy), can detect the earliest forms of colorectal cancer. Talk to your health care provider about this at age 38, when routine screening begins. Direct exam of the colon should be repeated every 5-10 years through age 92 years, unless early forms of precancerous polyps or small growths are found.  People who are at an increased risk for hepatitis B should be screened for this virus. You are considered at high risk for hepatitis B if:  You were born in a country where hepatitis B occurs often. Talk with your health care provider about which countries are considered high risk.  Your parents were born in a high-risk country and you have not received a shot to protect against hepatitis B (hepatitis B vaccine).  You have HIV or AIDS.  You use needles to inject street drugs.  You live with, or have sex with, someone who has hepatitis B.  You get hemodialysis treatment.  You take certain medicines for conditions like cancer, organ transplantation, and autoimmune conditions.  Hepatitis C blood testing is recommended for all people born from [birth year] through 1967 and any individual with known risks for hepatitis C.  Practice safe sex. Use condoms and avoid high-risk sexual practices to reduce the spread of sexually transmitted infections (STIs). STIs include gonorrhea, chlamydia, syphilis, trichomonas, herpes, HPV, and human immunodeficiency virus (HIV). Herpes, HIV, and HPV are viral illnesses that have no cure. They can result in disability, cancer, and death.  You should be screened for sexually transmitted illnesses (STIs) including gonorrhea  and chlamydia if:  You are sexually active and are younger than 24 years.  You are older than 24 years and your health care provider tells you that you are at risk for this type of infection.  Your sexual activity has changed since you were last screened and you are at an increased risk for chlamydia or gonorrhea. Ask your health care provider if you are at risk.  If you are at risk of being infected with HIV, it is recommended that you take a prescription medicine daily to prevent HIV infection. This is called preexposure prophylaxis (PrEP). You are considered at risk if:  You are sexually active and do not regularly use condoms or know the  HIV status of your partner(s).  You take drugs by injection.  You are sexually active with a partner who has HIV.  Talk with your health care provider about whether you are at high risk of being infected with HIV. If you choose to begin PrEP, you should first be tested for HIV. You should then be tested every 3 months for as long as you are taking PrEP.  Osteoporosis is a disease in which the bones lose minerals and strength with aging. This can result in serious bone fractures or breaks. The risk of osteoporosis can be identified using a bone density scan. Women ages 61 years and over and women at risk for fractures or osteoporosis should discuss screening with their health care providers. Ask your health care provider whether you should take a calcium supplement or vitamin D to reduce the rate of osteoporosis.  Menopause can be associated with physical symptoms and risks. Hormone replacement therapy is available to decrease symptoms and risks. You should talk to your health care provider about whether hormone replacement therapy is right for you.  Use sunscreen. Apply sunscreen liberally and repeatedly throughout the day. You should seek shade when your shadow is shorter than you. Protect yourself by wearing long sleeves, pants, a wide-brimmed hat, and  sunglasses year round, whenever you are outdoors.  Once a month, do a whole body skin exam, using a mirror to look at the skin on your back. Tell your health care provider of new moles, moles that have irregular borders, moles that are larger than a pencil eraser, or moles that have changed in shape or color.  Stay current with required vaccines (immunizations).  Influenza vaccine. All adults should be immunized every year.  Tetanus, diphtheria, and acellular pertussis (Td, Tdap) vaccine. Pregnant women should receive 1 dose of Tdap vaccine during each pregnancy. The dose should be obtained regardless of the length of time since the last dose. Immunization is preferred during the 27th-36th week of gestation. An adult who has not previously received Tdap or who does not know her vaccine status should receive 1 dose of Tdap. This initial dose should be followed by tetanus and diphtheria toxoids (Td) booster doses every 10 years. Adults with an unknown or incomplete history of completing a 3-dose immunization series with Td-containing vaccines should begin or complete a primary immunization series including a Tdap dose. Adults should receive a Td booster every 10 years.  Varicella vaccine. An adult without evidence of immunity to varicella should receive 2 doses or a second dose if she has previously received 1 dose. Pregnant females who do not have evidence of immunity should receive the first dose after pregnancy. This first dose should be obtained before leaving the health care facility. The second dose should be obtained 4-8 weeks after the first dose.  Human papillomavirus (HPV) vaccine. Females aged 13-26 years who have not received the vaccine previously should obtain the 3-dose series. The vaccine is not recommended for use in pregnant females. However, pregnancy testing is not needed before receiving a dose. If a female is found to be pregnant after receiving a dose, no treatment is needed. In that  case, the remaining doses should be delayed until after the pregnancy. Immunization is recommended for any person with an immunocompromised condition through the age of 59 years if she did not get any or all doses earlier. During the 3-dose series, the second dose should be obtained 4-8 weeks after the first dose. The third dose should be  obtained 24 weeks after the first dose and 16 weeks after the second dose.  Zoster vaccine. One dose is recommended for adults aged 51 years or older unless certain conditions are present.  Measles, mumps, and rubella (MMR) vaccine. Adults born before [birth year] generally are considered immune to measles and mumps. Adults born in [birth year] or later should have 1 or more doses of MMR vaccine unless there is a contraindication to the vaccine or there is laboratory evidence of immunity to each of the three diseases. A routine second dose of MMR vaccine should be obtained at least 28 days after the first dose for students attending postsecondary schools, health care workers, or international travelers. People who received inactivated measles vaccine or an unknown type of measles vaccine during 1963-1967 should receive 2 doses of MMR vaccine. People who received inactivated mumps vaccine or an unknown type of mumps vaccine before 1979 and are at high risk for mumps infection should consider immunization with 2 doses of MMR vaccine. For females of childbearing age, rubella immunity should be determined. If there is no evidence of immunity, females who are not pregnant should be vaccinated. If there is no evidence of immunity, females who are pregnant should delay immunization until after pregnancy. Unvaccinated health care workers born before [birth year] who lack laboratory evidence of measles, mumps, or rubella immunity or laboratory confirmation of disease should consider measles and mumps immunization with 2 doses of MMR vaccine or rubella immunization with 1 dose of MMR vaccine.  Pneumococcal  13-valent conjugate (PCV13) vaccine. When indicated, a person who is uncertain of his immunization history and has no record of immunization should receive the PCV13 vaccine. All adults 30 years of age and older should receive this vaccine. An adult aged 50 years or older who has certain medical conditions and has not been previously immunized should receive 1 dose of PCV13 vaccine. This PCV13 should be followed with a dose of pneumococcal polysaccharide (PPSV23) vaccine. Adults who are at high risk for pneumococcal disease should obtain the PPSV23 vaccine at least 8 weeks after the dose of PCV13 vaccine. Adults older than 41 years of age who have normal immune system function should obtain the PPSV23 vaccine dose at least 1 year after the dose of PCV13 vaccine.  Pneumococcal polysaccharide (PPSV23) vaccine. When PCV13 is also indicated, PCV13 should be obtained first. All adults aged 16 years and older should be immunized. An adult younger than age 68 years who has certain medical conditions should be immunized. Any person who resides in a nursing home or long-term care facility should be immunized. An adult smoker should be immunized. People with an immunocompromised condition and certain other conditions should receive both PCV13 and PPSV23 vaccines. People with human immunodeficiency virus (HIV) infection should be immunized as soon as possible after diagnosis. Immunization during chemotherapy or radiation therapy should be avoided. Routine use of PPSV23 vaccine is not recommended for American Indians, [place] Natives, or people younger than 65 years unless there are medical conditions that require PPSV23 vaccine. When indicated, people who have unknown immunization and have no record of immunization should receive PPSV23 vaccine. One-time revaccination 5 years after the first dose of PPSV23 is recommended for people aged 19-64 years who have chronic kidney failure, nephrotic syndrome, asplenia, or  immunocompromised conditions. People who received 1-2 doses of PPSV23 before age 85 years should receive another dose of PPSV23 vaccine at age 70 years or later if at least 5 years have passed since the previous dose. Doses  of PPSV23 are not needed for people immunized with PPSV23 at or after age 26 years.  Meningococcal vaccine. Adults with asplenia or persistent complement component deficiencies should receive 2 doses of quadrivalent meningococcal conjugate (MenACWY-D) vaccine. The doses should be obtained at least 2 months apart. Microbiologists working with certain meningococcal bacteria, [employer] recruits, people at risk during an outbreak, and people who travel to or live in countries with a high rate of meningitis should be immunized. A first-year college student up through age 2 years who is living in a residence hall should receive a dose if she did not receive a dose on or after her 16th birthday. Adults who have certain high-risk conditions should receive one or more doses of vaccine.  Hepatitis A vaccine. Adults who wish to be protected from this disease, have certain high-risk conditions, work with hepatitis A-infected animals, work in hepatitis A research labs, or travel to or work in countries with a high rate of hepatitis A should be immunized. Adults who were previously unvaccinated and who anticipate close contact with an international adoptee during the first 60 days after arrival in the [place] from a country with a high rate of hepatitis A should be immunized.  Hepatitis B vaccine. Adults who wish to be protected from this disease, have certain high-risk conditions, may be exposed to blood or other infectious body fluids, are household contacts or sex partners of hepatitis B positive people, are clients or workers in certain care facilities, or travel to or work in countries with a high rate of hepatitis B should be immunized.  Haemophilus influenzae type b (Hib) vaccine. A  previously unvaccinated person with asplenia or sickle cell disease or having a scheduled splenectomy should receive 1 dose of Hib vaccine. Regardless of previous immunization, a recipient of a hematopoietic stem cell transplant should receive a 3-dose series 6-12 months after her successful transplant. Hib vaccine is not recommended for adults with HIV infection. Preventive Services / Frequency Ages 58 to 40 years  Blood pressure check.** / Every 3-5 years.  Lipid and cholesterol check.** / Every 5 years beginning at age 67.  Clinical breast exam.** / Every 3 years for women in their 22s and 21s.  BRCA-related cancer risk assessment.** / For women who have family members with a BRCA-related cancer (breast, ovarian, tubal, or peritoneal cancers).  Pap test.** / Every 2 years from ages 75 through 45. Every 3 years starting at age 14 through age 70 or 37 with a history of 3 consecutive normal Pap tests.  HPV screening.** / Every 3 years from ages 19 through ages 62 to 44 with a history of 3 consecutive normal Pap tests.  Hepatitis C blood test.** / For any individual with known risks for hepatitis C.  Skin self-exam. / Monthly.  Influenza vaccine. / Every year.  Tetanus, diphtheria, and acellular pertussis (Tdap, Td) vaccine.** / Consult your health care provider. Pregnant women should receive 1 dose of Tdap vaccine during each pregnancy. 1 dose of Td every 10 years.  Varicella vaccine.** / Consult your health care provider. Pregnant females who do not have evidence of immunity should receive the first dose after pregnancy.  HPV vaccine. / 3 doses over 6 months, if 74 and younger. The vaccine is not recommended for use in pregnant females. However, pregnancy testing is not needed before receiving a dose.  Measles, mumps, rubella (MMR) vaccine.** / You need at least 1 dose of MMR if you were born in 1957 or later.  You may also need a 2nd dose. For females of childbearing age, rubella  immunity should be determined. If there is no evidence of immunity, females who are not pregnant should be vaccinated. If there is no evidence of immunity, females who are pregnant should delay immunization until after pregnancy.  Pneumococcal 13-valent conjugate (PCV13) vaccine.** / Consult your health care provider.  Pneumococcal polysaccharide (PPSV23) vaccine.** / 1 to 2 doses if you smoke cigarettes or if you have certain conditions.  Meningococcal vaccine.** / 1 dose if you are age 50 to 92 years and a Market researcher living in a residence hall, or have one of several medical conditions, you need to get vaccinated against meningococcal disease. You may also need additional booster doses.  Hepatitis A vaccine.** / Consult your health care provider.  Hepatitis B vaccine.** / Consult your health care provider.  Haemophilus influenzae type b (Hib) vaccine.** / Consult your health care provider. Ages 51 to 57 years  Blood pressure check.** / Every year.  Lipid and cholesterol check.** / Every 5 years beginning at age 28 years.  Lung cancer screening. / Every year if you are aged 47-80 years and have a 30-pack-year history of smoking and currently smoke or have quit within the past 15 years. Yearly screening is stopped once you have quit smoking for at least 15 years or develop a health problem that would prevent you from having lung cancer treatment.  Clinical breast exam.** / Every year after age 36 years.  BRCA-related cancer risk assessment.** / For women who have family members with a BRCA-related cancer (breast, ovarian, tubal, or peritoneal cancers).  Mammogram.** / Every year beginning at age 72 years and continuing for as long as you are in good health. Consult with your health care provider.  Pap test.** / Every 3 years starting at age 35 years through age 42 or 53 years with a history of 3 consecutive normal Pap tests.  HPV screening.** / Every 3 years from ages 60  years through ages 22 to 79 years with a history of 3 consecutive normal Pap tests.  Fecal occult blood test (FOBT) of stool. / Every year beginning at age 39 years and continuing until age 39 years. You may not need to do this test if you get a colonoscopy every 10 years.  Flexible sigmoidoscopy or colonoscopy.** / Every 5 years for a flexible sigmoidoscopy or every 10 years for a colonoscopy beginning at age 30 years and continuing until age 23 years.  Hepatitis C blood test.** / For all people born from [birth year] through 1967 and any individual with known risks for hepatitis C.  Skin self-exam. / Monthly.  Influenza vaccine. / Every year.  Tetanus, diphtheria, and acellular pertussis (Tdap/Td) vaccine.** / Consult your health care provider. Pregnant women should receive 1 dose of Tdap vaccine during each pregnancy. 1 dose of Td every 10 years.  Varicella vaccine.** / Consult your health care provider. Pregnant females who do not have evidence of immunity should receive the first dose after pregnancy.  Zoster vaccine.** / 1 dose for adults aged 1 years or older.  Measles, mumps, rubella (MMR) vaccine.** / You need at least 1 dose of MMR if you were born in 1957 or later. You may also need a second dose. For females of childbearing age, rubella immunity should be determined. If there is no evidence of immunity, females who are not pregnant should be vaccinated. If there is no evidence of immunity, females who are pregnant  should delay immunization until after pregnancy.  Pneumococcal 13-valent conjugate (PCV13) vaccine.** / Consult your health care provider.  Pneumococcal polysaccharide (PPSV23) vaccine.** / 1 to 2 doses if you smoke cigarettes or if you have certain conditions.  Meningococcal vaccine.** / Consult your health care provider.  Hepatitis A vaccine.** / Consult your health care provider.  Hepatitis B vaccine.** / Consult your health care provider.  Haemophilus influenzae type  b (Hib) vaccine.** / Consult your health care provider. Ages 76 years and over  Blood pressure check.** / Every year.  Lipid and cholesterol check.** / Every 5 years beginning at age 29 years.  Lung cancer screening. / Every year if you are aged 48-80 years and have a 30-pack-year history of smoking and currently smoke or have quit within the past 15 years. Yearly screening is stopped once you have quit smoking for at least 15 years or develop a health problem that would prevent you from having lung cancer treatment.  Clinical breast exam.** / Every year after age 23 years.  BRCA-related cancer risk assessment.** / For women who have family members with a BRCA-related cancer (breast, ovarian, tubal, or peritoneal cancers).  Mammogram.** / Every year beginning at age 19 years and continuing for as long as you are in good health. Consult with your health care provider.  Pap test.** / Every 3 years starting at age 3 years through age 22 or 90 years with 3 consecutive normal Pap tests. Testing can be stopped between 65 and 70 years with 3 consecutive normal Pap tests and no abnormal Pap or HPV tests in the past 10 years.  HPV screening.** / Every 3 years from ages 73 years through ages 52 or 74 years with a history of 3 consecutive normal Pap tests. Testing can be stopped between 65 and 70 years with 3 consecutive normal Pap tests and no abnormal Pap or HPV tests in the past 10 years.  Fecal occult blood test (FOBT) of stool. / Every year beginning at age 2 years and continuing until age 64 years. You may not need to do this test if you get a colonoscopy every 10 years.  Flexible sigmoidoscopy or colonoscopy.** / Every 5 years for a flexible sigmoidoscopy or every 10 years for a colonoscopy beginning at age 51 years and continuing until age 59 years.  Hepatitis C blood test.** / For all people born from [birth year] through 1967 and any individual with known risks for hepatitis C.  Osteoporosis  screening.** / A one-time screening for women ages 64 years and over and women at risk for fractures or osteoporosis.  Skin self-exam. / Monthly.  Influenza vaccine. / Every year.  Tetanus, diphtheria, and acellular pertussis (Tdap/Td) vaccine.** / 1 dose of Td every 10 years.  Varicella vaccine.** / Consult your health care provider.  Zoster vaccine.** / 1 dose for adults aged 58 years or older.  Pneumococcal 13-valent conjugate (PCV13) vaccine.** / Consult your health care provider.  Pneumococcal polysaccharide (PPSV23) vaccine.** / 1 dose for all adults aged 2 years and older.  Meningococcal vaccine.** / Consult your health care provider.  Hepatitis A vaccine.** / Consult your health care provider.  Hepatitis B vaccine.** / Consult your health care provider.  Haemophilus influenzae type b (Hib) vaccine.** / Consult your health care provider. ** Family history and personal history of risk and conditions may change your health care provider's recommendations.   This information is not intended to replace advice given to you by your health care provider. Make  sure you discuss any questions you have with your health care provider.   Document Released: 11/15/2001 Document Revised: 10/10/2014 Document Reviewed: 02/14/2011 Elsevier Interactive Patient Education [employer].

## 2015-08-25 ENCOUNTER — Telehealth: Payer: Self-pay | Admitting: Family Medicine

## 2015-08-25 NOTE — Telephone Encounter (Signed)
[facility]

## 2015-08-25 NOTE — Telephone Encounter (Signed)
Patient requesting referral please send to [facility] [address], [place], [place] [id number] [phone]

## 2015-08-30 ENCOUNTER — Encounter: Payer: Self-pay | Admitting: Family Medicine

## 2015-08-30 DIAGNOSIS — E049 Nontoxic goiter, unspecified: Secondary | ICD-10-CM | POA: Insufficient documentation

## 2015-08-30 DIAGNOSIS — E039 Hypothyroidism, unspecified: Secondary | ICD-10-CM

## 2015-08-30 DIAGNOSIS — R251 Tremor, unspecified: Secondary | ICD-10-CM

## 2015-08-30 HISTORY — DX: Nontoxic goiter, unspecified: E04.9

## 2015-08-30 HISTORY — DX: Tremor, unspecified: R25.1

## 2015-08-30 HISTORY — DX: Hypothyroidism, unspecified: E03.9

## 2015-08-30 NOTE — Assessment & Plan Note (Signed)
Low. Start [id number] Iu weekly and 2000 IU daily and recheck in 3 months

## 2015-08-30 NOTE — Assessment & Plan Note (Signed)
A couple of weeks ago. Neck pain and light headed feeling improving received some chiropractic adjustments with good response referred back for more therapy. Encouraged moist heat and gentle stretching as tolerated. May try NSAIDs and prescription meds as directed and report if symptoms worsen or seek immediate care

## 2015-08-30 NOTE — Assessment & Plan Note (Signed)
Uses Alprazolam infrequently with good results
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
## 2015-08-30 NOTE — Assessment & Plan Note (Addendum)
Work up unremarkable.

## 2015-08-30 NOTE — Assessment & Plan Note (Signed)
Intermittent. No obvious pattern. Encouraged to minimize caffeine and monitor can refer to neurology as needed.

## 2015-08-31 ENCOUNTER — Telehealth: Payer: Self-pay | Admitting: Family Medicine

## 2015-08-31 NOTE — Telephone Encounter (Signed)
Called left msg. To call back 

## 2015-08-31 NOTE — Telephone Encounter (Signed)
-----   Message from Bradd CanaryStacey A Blyth, MD sent at 08/30/2015 11:51 AM EST ----- Notify vitamin D low, start 5000) iu weekly and 2000 IU daily and recheck level in 3 months

## 2015-09-03 NOTE — Telephone Encounter (Signed)
Patient informed. 

## 2015-09-08 ENCOUNTER — Telehealth: Payer: Self-pay | Admitting: Family Medicine

## 2015-09-08 MED ORDER — ERGOCALCIFEROL 1.25 MG (50000 UT) PO CAPS: 50000.0000 [IU] | ORAL_CAPSULE | ORAL | Status: DC

## 2015-09-08 NOTE — Telephone Encounter (Signed)
Patient informed prescription sent to pharmacy(left detailed message as she requested) of results.

## 2015-09-08 NOTE — Telephone Encounter (Signed)
Relation to WU:JWJXpt:self Call back number:[phone]   Reason for call:  Patient returning your call regarding lab results patient states she is going to work and please leave a detailed message

## 2015-09-08 NOTE — Addendum Note (Signed)
Addended by: Scharlene GlossEWING, Giavonni Fonder B on: 09/08/2015 01:49 PM   Modules accepted: Orders, Medications

## 2015-09-08 NOTE — Telephone Encounter (Signed)
Patient informed of results from 08/31/15

## 2016-03-07 ENCOUNTER — Telehealth: Payer: Self-pay | Admitting: Family Medicine

## 2016-03-07 ENCOUNTER — Ambulatory Visit: Payer: 59 | Admitting: Family Medicine

## 2016-03-07 NOTE — Telephone Encounter (Signed)
Pt just called in realizing she missed appt this morning. She apologized for your time. Pt has rescheduled for 6/26. Charge or no charge?

## 2016-03-07 NOTE — Telephone Encounter (Signed)
No charge. 

## 2016-03-28 ENCOUNTER — Ambulatory Visit (INDEPENDENT_AMBULATORY_CARE_PROVIDER_SITE_OTHER): Payer: 59 | Admitting: Family Medicine

## 2016-03-28 ENCOUNTER — Encounter: Payer: Self-pay | Admitting: Family Medicine

## 2016-03-28 VITALS — BP 112/62 | HR 68 | Temp 98.4°F | Ht 63.0 in | Wt 162.4 lb

## 2016-03-28 DIAGNOSIS — E559 Vitamin D deficiency, unspecified: Secondary | ICD-10-CM

## 2016-03-28 DIAGNOSIS — R300 Dysuria: Secondary | ICD-10-CM | POA: Diagnosis not present

## 2016-03-28 DIAGNOSIS — N926 Irregular menstruation, unspecified: Secondary | ICD-10-CM

## 2016-03-28 DIAGNOSIS — R3915 Urgency of urination: Secondary | ICD-10-CM | POA: Diagnosis not present

## 2016-03-28 DIAGNOSIS — K219 Gastro-esophageal reflux disease without esophagitis: Secondary | ICD-10-CM

## 2016-03-28 LAB — POC URINALSYSI DIPSTICK (AUTOMATED)
Bilirubin, UA: NEGATIVE
Glucose, UA: NEGATIVE
Ketones, UA: NEGATIVE
Leukocytes, UA: NEGATIVE
NITRITE UA: NEGATIVE
PH UA: 6
PROTEIN UA: NEGATIVE
RBC UA: NEGATIVE
SPEC GRAV UA: 1.025
UROBILINOGEN UA: NEGATIVE

## 2016-03-28 NOTE — Assessment & Plan Note (Signed)
Avoid offending foods, start probiotics. Do not eat large meals in late evening and consider raising head of bed.  
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
## 2016-03-28 NOTE — Assessment & Plan Note (Signed)
Has only been taking 1 cap monthly. Will recheck levels today

## 2016-03-28 NOTE — Progress Notes (Signed)
Pre visit review using our clinic review tool, if applicable. No additional management support is needed unless otherwise documented below in the visit note. 

## 2016-03-28 NOTE — Patient Instructions (Signed)
Vitamin D Deficiency Vitamin D deficiency is when your body does not have enough vitamin D. Vitamin D is important to your body for many reasons:  It helps the body to absorb two important minerals, called calcium and phosphorus.  It plays a role in bone health.  It may help to prevent some diseases, such as diabetes and multiple sclerosis.  It plays a role in muscle function, including heart function. You can get vitamin D by:  Eating foods that naturally contain vitamin D.  Eating or drinking milk or other dairy products that have vitamin D added to them.  Taking a vitamin D supplement or a multivitamin supplement that contains vitamin D.  Being in the sun. Your body naturally makes vitamin D when your skin is exposed to sunlight. Your body changes the sunlight into a form of the vitamin that the body can use. If vitamin D deficiency is severe, it can cause a condition in which your bones become soft. In adults, this condition is called osteomalacia. In children, this condition is called rickets. CAUSES Vitamin D deficiency may be caused by:  Not eating enough foods that contain vitamin D.  Not getting enough sun exposure.  Having certain digestive system diseases that make it difficult for your body to absorb vitamin D. These diseases include Crohn disease, chronic pancreatitis, and cystic fibrosis.  Having a surgery in which a part of the stomach or a part of the small intestine is removed.  Being obese.  Having chronic kidney disease or liver disease. RISK FACTORS This condition is more likely to develop in:  Older people.  People who do not spend much time outdoors.  People who live in a long-term care facility.  People who have had broken bones.  People with weak or thin bones (osteoporosis).  People who have a disease or condition that changes how the body absorbs vitamin D.  People who have dark skin.  People who take certain medicines, such as steroid  medicines or certain seizure medicines.  People who are overweight or obese. SYMPTOMS In mild cases of vitamin D deficiency, there may not be any symptoms. If the condition is severe, symptoms may include:  Bone pain.  Muscle pain.  Falling often.  Broken bones caused by a minor injury. DIAGNOSIS This condition is usually diagnosed with a blood test.  TREATMENT Treatment for this condition may depend on what caused the condition. Treatment options include:  Taking vitamin D supplements.  Taking a calcium supplement. Your health care provider will suggest what dose is best for you. HOME CARE INSTRUCTIONS  Take medicines and supplements only as told by your health care provider.  Eat foods that contain vitamin D. Choices include:  Fortified dairy products, cereals, or juices. Fortified means that vitamin D has been added to the food. Check the label on the package to be sure.  Fatty fish, such as salmon or trout.  Eggs.  Oysters.  Do not use a tanning bed.  Maintain a healthy weight. Lose weight, if needed.  Keep all follow-up visits as told by your health care provider. This is important. SEEK MEDICAL CARE IF:  Your symptoms do not go away.  You feel like throwing up (nausea) or you throw up (vomit).  You have fewer bowel movements than usual or it is difficult for you to have a bowel movement (constipation).   This information is not intended to replace advice given to you by your health care provider. Make sure you discuss   any questions you have with your health care provider.   Document Released: 12/12/2011 Document Revised: 06/10/2015 Document Reviewed: 02/04/2015 Elsevier Interactive Patient Education 2016 Elsevier Inc.  

## 2016-03-30 LAB — CULTURE, URINE COMPREHENSIVE
COLONY COUNT: NO GROWTH
Organism ID, Bacteria: NO GROWTH

## 2016-06-30 ENCOUNTER — Other Ambulatory Visit: Payer: 59

## 2016-09-29 ENCOUNTER — Encounter: Payer: Self-pay | Admitting: Family Medicine

## 2016-09-29 ENCOUNTER — Ambulatory Visit (INDEPENDENT_AMBULATORY_CARE_PROVIDER_SITE_OTHER): Payer: 59 | Admitting: Family Medicine

## 2016-09-29 VITALS — BP 120/80 | HR 67 | Temp 98.2°F | Ht 63.0 in | Wt 153.2 lb

## 2016-09-29 DIAGNOSIS — E559 Vitamin D deficiency, unspecified: Secondary | ICD-10-CM

## 2016-09-29 DIAGNOSIS — K219 Gastro-esophageal reflux disease without esophagitis: Secondary | ICD-10-CM | POA: Diagnosis not present

## 2016-09-29 DIAGNOSIS — Z23 Encounter for immunization: Secondary | ICD-10-CM

## 2016-09-29 DIAGNOSIS — F418 Other specified anxiety disorders: Secondary | ICD-10-CM

## 2016-09-29 DIAGNOSIS — Z0000 Encounter for general adult medical examination without abnormal findings: Secondary | ICD-10-CM | POA: Diagnosis not present

## 2016-09-29 HISTORY — DX: Encounter for general adult medical examination without abnormal findings: Z00.00

## 2016-09-29 LAB — LIPID PANEL
CHOL/HDL RATIO: 3
Cholesterol: 192 mg/dL (ref 0–200)
HDL: 57.8 mg/dL (ref >39.00)
LDL Cholesterol: 117 mg/dL — ABNORMAL HIGH (ref 0–99)
NONHDL: 134.39
Triglycerides: 88 mg/dL (ref 0.0–149.0)
VLDL: 17.6 mg/dL (ref 0.0–40.0)

## 2016-09-29 LAB — COMPREHENSIVE METABOLIC PANEL
ALT: 14 U/L (ref 0–35)
AST: 15 U/L (ref 0–37)
Albumin: 4.6 g/dL (ref 3.5–5.2)
Alkaline Phosphatase: 45 U/L (ref 39–117)
BILIRUBIN TOTAL: 0.4 mg/dL (ref 0.2–1.2)
BUN: 13 mg/dL (ref 6–23)
CO2: 29 meq/L (ref 19–32)
CREATININE: 0.76 mg/dL (ref 0.40–1.20)
Calcium: 9.4 mg/dL (ref 8.4–10.5)
Chloride: 103 mEq/L (ref 96–112)
GFR: 88.42 mL/min (ref >60.00)
GLUCOSE: 99 mg/dL (ref 70–99)
Potassium: 3.8 mEq/L (ref 3.5–5.1)
SODIUM: 138 meq/L (ref 135–145)
Total Protein: 7.3 g/dL (ref 6.0–8.3)

## 2016-09-29 LAB — CBC
HCT: 39.1 % (ref 36.0–46.0)
Hemoglobin: 13.3 g/dL (ref 12.0–15.0)
MCHC: 34.1 g/dL (ref 30.0–36.0)
MCV: 84.9 fl (ref 78.0–100.0)
Platelets: 202 10*3/uL (ref 150.0–400.0)
RBC: 4.6 Mil/uL (ref 3.87–5.11)
RDW: 12.8 % (ref 11.5–15.5)
WBC: 5.7 10*3/uL (ref 4.0–10.5)

## 2016-09-29 LAB — TSH: TSH: 1.25 u[IU]/mL (ref 0.35–4.50)

## 2016-09-29 LAB — VITAMIN D 25 HYDROXY (VIT D DEFICIENCY, FRACTURES): VITD: 37.15 ng/mL (ref 30.00–100.00)

## 2016-09-29 NOTE — Patient Instructions (Signed)

## 2016-09-29 NOTE — Progress Notes (Signed)
Pre visit review using our clinic review tool, if applicable. No additional management support is needed unless otherwise documented below in the visit note. 

## 2016-09-29 NOTE — Assessment & Plan Note (Addendum)
Patient encouraged to maintain heart healthy diet, regular exercise, adequate sleep. Consider daily probiotics. Take medications as prescribed. Describes a vasovagal response to position changes while lying prone reading a book and changes positions. No syncope and not increasing likely benign process

## 2016-09-29 NOTE — Assessment & Plan Note (Signed)
Check level today. Now is taking the daily 2000 IU but forgets frequently. Takes 4 or 5 at a time. Has taken roughly 12 of the 2000 IU caps in past 2 weeks.

## 2016-09-29 NOTE — Assessment & Plan Note (Addendum)
No difficulty or meds at this time. Continue to avoid offending foods. Does describe a epigastric pain with radiating around flanks infrequently. She will report worsening symptoms

## 2016-09-29 NOTE — Assessment & Plan Note (Signed)
Doing very well. No need for meds at this time. Used 1/2 of one Alprazolam in the past year.
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
## 2016-11-08 ENCOUNTER — Telehealth: Payer: Self-pay | Admitting: Family Medicine

## 2016-11-08 MED ORDER — OSELTAMIVIR PHOSPHATE 75 MG PO CAPS: 75.0000 mg | ORAL_CAPSULE | Freq: Every day | ORAL | 0 refills | Status: DC

## 2016-11-08 NOTE — Telephone Encounter (Signed)
Self.   Pt says that her father has been Dx with the flu. She says that it has been suggested that she should ask her provider for a Rx for Tamiflu as a precaution.  [id number] [place], [place] - [phone]

## 2016-11-08 NOTE — Telephone Encounter (Signed)
Error

## 2016-11-08 NOTE — Telephone Encounter (Signed)
Spoke with patient and sent in tamiflu.  PC

## 2016-12-20 DIAGNOSIS — N920 Excessive and frequent menstruation with regular cycle: Secondary | ICD-10-CM | POA: Diagnosis not present

## 2016-12-20 DIAGNOSIS — Z01419 Encounter for gynecological examination (general) (routine) without abnormal findings: Secondary | ICD-10-CM | POA: Diagnosis not present

## 2017-01-18 ENCOUNTER — Other Ambulatory Visit: Payer: Self-pay | Admitting: Obstetrics and Gynecology

## 2017-01-18 DIAGNOSIS — R938 Abnormal findings on diagnostic imaging of other specified body structures: Secondary | ICD-10-CM | POA: Diagnosis not present

## 2017-01-18 DIAGNOSIS — N921 Excessive and frequent menstruation with irregular cycle: Secondary | ICD-10-CM | POA: Diagnosis not present

## 2017-01-18 DIAGNOSIS — Z1231 Encounter for screening mammogram for malignant neoplasm of breast: Secondary | ICD-10-CM

## 2017-01-27 DIAGNOSIS — N8500 Endometrial hyperplasia, unspecified: Secondary | ICD-10-CM | POA: Diagnosis not present

## 2017-01-27 DIAGNOSIS — N920 Excessive and frequent menstruation with regular cycle: Secondary | ICD-10-CM | POA: Diagnosis not present

## 2017-02-06 ENCOUNTER — Ambulatory Visit: Payer: 59

## 2017-02-22 ENCOUNTER — Ambulatory Visit
Admission: RE | Admit: 2017-02-22 | Discharge: 2017-02-22 | Disposition: A | Discharge status: Home / Self Care | Payer: 59 | Source: Ambulatory Visit | Attending: Obstetrics and Gynecology | Admitting: Obstetrics and Gynecology

## 2017-02-22 DIAGNOSIS — Z1231 Encounter for screening mammogram for malignant neoplasm of breast: Secondary | ICD-10-CM

## 2017-06-27 DIAGNOSIS — N921 Excessive and frequent menstruation with irregular cycle: Secondary | ICD-10-CM | POA: Diagnosis not present

## 2017-06-27 DIAGNOSIS — R939 Diagnostic imaging inconclusive due to excess body fat of patient: Secondary | ICD-10-CM | POA: Diagnosis not present

## 2017-06-27 DIAGNOSIS — R51 Headache: Secondary | ICD-10-CM | POA: Diagnosis not present

## 2017-06-27 DIAGNOSIS — Z23 Encounter for immunization: Secondary | ICD-10-CM | POA: Diagnosis not present

## 2017-07-25 ENCOUNTER — Ambulatory Visit (INDEPENDENT_AMBULATORY_CARE_PROVIDER_SITE_OTHER): Payer: 59 | Admitting: Family Medicine

## 2017-07-25 ENCOUNTER — Encounter: Payer: Self-pay | Admitting: Family Medicine

## 2017-07-25 DIAGNOSIS — R51 Headache: Secondary | ICD-10-CM

## 2017-07-25 DIAGNOSIS — R519 Headache, unspecified: Secondary | ICD-10-CM

## 2017-07-25 HISTORY — DX: Headache, unspecified: R51.9

## 2017-07-25 LAB — TSH: TSH: 1.05 u[IU]/mL (ref 0.35–4.50)
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
## 2017-07-25 NOTE — Patient Instructions (Signed)
64 oz of clear fluids daily and alcohol or caffeine. Drink extra for each of these. Small frequent meals with lean proteins.  Try Excedrin Tension or Migraine as needed for headaches.  Try Zyrtec/Cetirizine 10 mg for any allergic symptoms   General Headache Without Cause A headache is pain or discomfort felt around the head or neck area. There are many causes and types of headaches. In some cases, the cause may not be found. Follow these instructions at home: Managing pain  Take over-the-counter and prescription medicines only as told by your doctor.  Lie down in a dark, quiet room when you have a headache.  If directed, apply ice to the head and neck area: ? Put ice in a plastic bag. ? Place a towel between your skin and the bag. ? Leave the ice on for 20 minutes, 2-3 times per day.  Use a heating pad or hot shower to apply heat to the head and neck area as told by your doctor.  Keep lights dim if bright lights bother you or make your headaches worse. Eating and drinking  Eat meals on a regular schedule.  Lessen how much alcohol you drink.  Lessen how much caffeine you drink, or stop drinking caffeine. General instructions  Keep all follow-up visits as told by your doctor. This is important.  Keep a journal to find out if certain things bring on headaches. For example, write down: ? What you eat and drink. ? How much sleep you get. ? Any change to your diet or medicines.  Relax by getting a massage or doing other relaxing activities.  Lessen stress.  Sit up straight. Do not tighten (tense) your muscles.  Do not use tobacco products. This includes cigarettes, chewing tobacco, or e-cigarettes. If you need help quitting, ask your doctor.  Exercise regularly as told by your doctor.  Get enough sleep. This often means 7-9 hours of sleep. Contact a doctor if:  Your symptoms are not helped by medicine.  You have a headache that feels different than the other  headaches.  You feel sick to your stomach (nauseous) or you throw up (vomit).  You have a fever. Get help right away if:  Your headache becomes really bad.  You keep throwing up.  You have a stiff neck.  You have trouble seeing.  You have trouble speaking.  You have pain in the eye or ear.  Your muscles are weak or you lose muscle control.  You lose your balance or have trouble walking.  You feel like you will pass out (faint) or you pass out.  You have confusion. This information is not intended to replace advice given to you by your health care provider. Make sure you discuss any questions you have with your health care provider. Document Released: 06/28/2008 Document Revised: 02/25/2016 Document Reviewed: 01/12/2015 Elsevier Interactive Patient Education  [employer]2018 [employer] Inc.

## 2017-07-25 NOTE — Assessment & Plan Note (Addendum)
Encouraged increased hydration, 64 ounces of clear fluids daily. Minimize alcohol and caffeine. Eat small frequent meals with lean proteins and complex carbs. Avoid high and low blood sugars. Get adequate sleep, 7-8 hours a night. Needs exercise daily preferably in the morning. Try Excedrine Tension prn and report if worsens try treating allergies with Cetirizine. Return if worsens. Check TSH. Likely mutifactorial with some recent dental complications, poor sleep and allergies. Her symptoms became bad enough to have associated photophobia but for last few days the headaches have resolved.

## 2017-10-05 ENCOUNTER — Encounter: Payer: Self-pay | Admitting: Family Medicine

## 2017-10-05 ENCOUNTER — Ambulatory Visit (INDEPENDENT_AMBULATORY_CARE_PROVIDER_SITE_OTHER): Payer: 59 | Admitting: Family Medicine

## 2017-10-05 VITALS — BP 98/68 | HR 89 | Temp 98.7°F | Resp 18 | Ht 63.0 in | Wt 147.6 lb

## 2017-10-05 DIAGNOSIS — R251 Tremor, unspecified: Secondary | ICD-10-CM | POA: Diagnosis not present

## 2017-10-05 DIAGNOSIS — E559 Vitamin D deficiency, unspecified: Secondary | ICD-10-CM | POA: Diagnosis not present

## 2017-10-05 DIAGNOSIS — R51 Headache: Secondary | ICD-10-CM | POA: Diagnosis not present

## 2017-10-05 DIAGNOSIS — Z0000 Encounter for general adult medical examination without abnormal findings: Secondary | ICD-10-CM

## 2017-10-05 DIAGNOSIS — R519 Headache, unspecified: Secondary | ICD-10-CM
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
## 2017-10-05 NOTE — Assessment & Plan Note (Signed)
resolved 

## 2017-10-05 NOTE — Assessment & Plan Note (Signed)
Check level today 

## 2017-10-05 NOTE — Assessment & Plan Note (Addendum)
Patient encouraged to maintain heart healthy diet, regular exercise, adequate sleep. Consider daily probiotics. Take medications as prescribed. Labs [facility] about 8 months ago with good results. Labs ordered today

## 2017-10-05 NOTE — Patient Instructions (Signed)
Preventive Care 18-39 Years, Female Preventive care refers to lifestyle choices and visits with your health care provider that can promote health and wellness. What does preventive care include?  A yearly physical exam. This is also called an annual well check.  Dental exams once or twice a year.  Routine eye exams. Ask your health care provider how often you should have your eyes checked.  Personal lifestyle choices, including: ? Daily care of your teeth and gums. ? Regular physical activity. ? Eating a healthy diet. ? Avoiding tobacco and drug use. ? Limiting alcohol use. ? Practicing safe sex. ? Taking vitamin and mineral supplements as recommended by your health care provider. What happens during an annual well check? The services and screenings done by your health care provider during your annual well check will depend on your age, overall health, lifestyle risk factors, and family history of disease. Counseling Your health care provider may ask you questions about your:  Alcohol use.  Tobacco use.  Drug use.  Emotional well-being.  Home and relationship well-being.  Sexual activity.  Eating habits.  Work and work Statistician.  Method of birth control.  Menstrual cycle.  Pregnancy history.  Screening You may have the following tests or measurements:  Height, weight, and BMI.  Diabetes screening. This is done by checking your blood sugar (glucose) after you have not eaten for a while (fasting).  Blood pressure.  Lipid and cholesterol levels. These may be checked every 5 years starting at age 66.  Skin check.  Hepatitis C blood test.  Hepatitis B blood test.  Sexually transmitted disease (STD) testing.  BRCA-related cancer screening. This may be done if you have a family history of breast, ovarian, tubal, or peritoneal cancers.  Pelvic exam and Pap test. This may be done every 3 years starting at age 40. Starting at age 59, this may be done every 5  years if you have a Pap test in combination with an HPV test.  Discuss your test results, treatment options, and if necessary, the need for more tests with your health care provider. Vaccines Your health care provider may recommend certain vaccines, such as:  Influenza vaccine. This is recommended every year.  Tetanus, diphtheria, and acellular pertussis (Tdap, Td) vaccine. You may need a Td booster every 10 years.  Varicella vaccine. You may need this if you have not been vaccinated.  HPV vaccine. If you are 69 or younger, you may need three doses over 6 months.  Measles, mumps, and rubella (MMR) vaccine. You may need at least one dose of MMR. You may also need a second dose.  Pneumococcal 13-valent conjugate (PCV13) vaccine. You may need this if you have certain conditions and were not previously vaccinated.  Pneumococcal polysaccharide (PPSV23) vaccine. You may need one or two doses if you smoke cigarettes or if you have certain conditions.  Meningococcal vaccine. One dose is recommended if you are age 27-21 years and a first-year college student living in a residence hall, or if you have one of several medical conditions. You may also need additional booster doses.  Hepatitis A vaccine. You may need this if you have certain conditions or if you travel or work in places where you may be exposed to hepatitis A.  Hepatitis B vaccine. You may need this if you have certain conditions or if you travel or work in places where you may be exposed to hepatitis B.  Haemophilus influenzae type b (Hib) vaccine. You may need this if  you have certain risk factors.  Talk to your health care provider about which screenings and vaccines you need and how often you need them. This information is not intended to replace advice given to you by your health care provider. Make sure you discuss any questions you have with your health care provider. Document Released: 11/15/2001 Document Revised: 06/08/2016  Document Reviewed: 07/21/2015 Elsevier Interactive Patient Education  [employer].

## 2017-10-06 LAB — CBC
HCT: 39.7 % (ref 36.0–46.0)
HEMOGLOBIN: 13.2 g/dL (ref 12.0–15.0)
MCHC: 33.2 g/dL (ref 30.0–36.0)
MCV: 88.6 fl (ref 78.0–100.0)
PLATELETS: 212 10*3/uL (ref 150.0–400.0)
RBC: 4.47 Mil/uL (ref 3.87–5.11)
RDW: 12.9 % (ref 11.5–15.5)
WBC: 6.2 10*3/uL (ref 4.0–10.5)

## 2017-10-06 LAB — LIPID PANEL
CHOL/HDL RATIO: 3
Cholesterol: 174 mg/dL (ref 0–200)
HDL: 58.3 mg/dL (ref >39.00)
LDL Cholesterol: 96 mg/dL (ref 0–99)
NONHDL: 116.12
TRIGLYCERIDES: 99 mg/dL (ref 0.0–149.0)
VLDL: 19.8 mg/dL (ref 0.0–40.0)

## 2017-10-06 LAB — VITAMIN D 25 HYDROXY (VIT D DEFICIENCY, FRACTURES): VITD: 18.59 ng/mL — AB (ref 30.00–100.00)

## 2017-10-06 LAB — COMPREHENSIVE METABOLIC PANEL
ALK PHOS: 39 U/L (ref 39–117)
ALT: 8 U/L (ref 0–35)
AST: 11 U/L (ref 0–37)
Albumin: 4.4 g/dL (ref 3.5–5.2)
BILIRUBIN TOTAL: 0.3 mg/dL (ref 0.2–1.2)
BUN: 12 mg/dL (ref 6–23)
CALCIUM: 9.1 mg/dL (ref 8.4–10.5)
CO2: 30 mEq/L (ref 19–32)
CREATININE: 0.68 mg/dL (ref 0.40–1.20)
Chloride: 105 mEq/L (ref 96–112)
GFR: 100.05 mL/min (ref >60.00)
GLUCOSE: 105 mg/dL — AB (ref 70–99)
Potassium: 4.2 mEq/L (ref 3.5–5.1)
Sodium: 143 mEq/L (ref 135–145)
TOTAL PROTEIN: 6.9 g/dL (ref 6.0–8.3)

## 2017-10-06 LAB — TSH: TSH: 1.47 u[IU]/mL (ref 0.35–4.50)

## 2017-10-16 MED ORDER — VITAMIN D (ERGOCALCIFEROL) 1.25 MG (50000 UNIT) PO CAPS: 50000.0000 [IU] | ORAL_CAPSULE | ORAL | 4 refills | Status: DC

## 2017-10-16 NOTE — Addendum Note (Signed)
Addended by: Crissie SicklesARTER, Chidiebere Wynn A on: 10/16/2017 08:44 AM   Modules accepted: Orders

## 2017-12-26 ENCOUNTER — Ambulatory Visit: Payer: 59 | Admitting: Family Medicine

## 2017-12-26 VITALS — BP 108/68 | HR 78 | Temp 97.9°F | Resp 18 | Wt 149.2 lb

## 2017-12-26 DIAGNOSIS — K909 Intestinal malabsorption, unspecified: Secondary | ICD-10-CM | POA: Diagnosis not present

## 2017-12-26 DIAGNOSIS — R109 Unspecified abdominal pain: Secondary | ICD-10-CM

## 2017-12-26 DIAGNOSIS — E559 Vitamin D deficiency, unspecified: Secondary | ICD-10-CM

## 2017-12-26 DIAGNOSIS — R197 Diarrhea, unspecified: Secondary | ICD-10-CM | POA: Diagnosis not present

## 2017-12-26 DIAGNOSIS — K589 Irritable bowel syndrome without diarrhea: Secondary | ICD-10-CM

## 2017-12-26 NOTE — Patient Instructions (Addendum)
Spelt is a good to test  NOW company at [email or web address] has a 10 strin probiotic [employer] probiotics  Diarrhea, Adult Diarrhea is frequent loose and watery bowel movements. Diarrhea can make you feel weak and cause you to become dehydrated. Dehydration can make you tired and thirsty, cause you to have a dry mouth, and decrease how often you urinate. Diarrhea typically lasts 2-3 days. However, it can last longer if it is a sign of something more serious. It is important to treat your diarrhea as told by your health care provider. Follow these instructions at home: Eating and drinking  Follow these recommendations as told by your health care provider:  Take an oral rehydration solution (ORS). This is a drink that is sold at pharmacies and retail stores.  Drink clear fluids, such as water, ice chips, diluted fruit juice, and low-calorie sports drinks.  Eat bland, easy-to-digest foods in small amounts as you are able. These foods include bananas, applesauce, rice, lean meats, toast, and crackers.  Avoid drinking fluids that contain a lot of sugar or caffeine, such as energy drinks, sports drinks, and soda.  Avoid alcohol.  Avoid spicy or fatty foods.  General instructions  Drink enough fluid to keep your urine clear or pale yellow.  Wash your hands often. If soap and water are not available, use hand sanitizer.  Make sure that all people in your household wash their hands well and often.  Take over-the-counter and prescription medicines only as told by your health care provider.  Rest at home while you recover.  Watch your condition for any changes.  Take a warm bath to relieve any burning or pain from frequent diarrhea episodes.  Keep all follow-up visits as told by your health care provider. This is important. Contact a health care provider if:  You have a fever.  Your diarrhea gets worse.  You have new symptoms.  You cannot keep fluids down.  You  feel light-headed or dizzy.  You have a headache  You have muscle cramps. Get help right away if:  You have chest pain.  You feel extremely weak or you faint.  You have bloody or black stools or stools that look like tar.  You have severe pain, cramping, or bloating in your abdomen.  You have trouble breathing or you are breathing very quickly.  Your heart is beating very quickly.  Your skin feels cold and clammy.  You feel confused.  You have signs of dehydration, such as: ? Dark urine, very little urine, or no urine. ? Cracked lips. ? Dry mouth. ? Sunken eyes. ? Sleepiness. ? Weakness. This information is not intended to replace advice given to you by your health care provider. Make sure you discuss any questions you have with your health care provider. Document Released: 09/09/2002 Document Revised: 01/28/2016 Document Reviewed: 05/26/2015 Elsevier Interactive Patient Education  [employer]2018 Elsevier Inc. st when you are feeling better.
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
## 2017-12-27 LAB — COMPREHENSIVE METABOLIC PANEL
ALK PHOS: 41 U/L (ref 39–117)
ALT: 7 U/L (ref 0–35)
AST: 11 U/L (ref 0–37)
Albumin: 4.5 g/dL (ref 3.5–5.2)
BUN: 10 mg/dL (ref 6–23)
CO2: 29 meq/L (ref 19–32)
Calcium: 9.6 mg/dL (ref 8.4–10.5)
Chloride: 103 mEq/L (ref 96–112)
Creatinine, Ser: 0.66 mg/dL (ref 0.40–1.20)
GFR: 103.45 mL/min (ref >60.00)
GLUCOSE: 105 mg/dL — AB (ref 70–99)
POTASSIUM: 4.4 meq/L (ref 3.5–5.1)
SODIUM: 142 meq/L (ref 135–145)
TOTAL PROTEIN: 7.5 g/dL (ref 6.0–8.3)
Total Bilirubin: 0.6 mg/dL (ref 0.2–1.2)

## 2017-12-27 LAB — CBC WITH DIFFERENTIAL/PLATELET
BASOS ABS: 0.1 10*3/uL (ref 0.0–0.1)
Basophils Relative: 1.2 % (ref 0.0–3.0)
Eosinophils Absolute: 0.1 10*3/uL (ref 0.0–0.7)
Eosinophils Relative: 0.8 % (ref 0.0–5.0)
HCT: 40.4 % (ref 36.0–46.0)
Hemoglobin: 13.5 g/dL (ref 12.0–15.0)
LYMPHS ABS: 2.2 10*3/uL (ref 0.7–4.0)
Lymphocytes Relative: 33.7 % (ref 12.0–46.0)
MCHC: 33.3 g/dL (ref 30.0–36.0)
MCV: 89.3 fl (ref 78.0–100.0)
MONOS PCT: 5.6 % (ref 3.0–12.0)
Monocytes Absolute: 0.4 10*3/uL (ref 0.1–1.0)
NEUTROS ABS: 3.7 10*3/uL (ref 1.4–7.7)
NEUTROS PCT: 58.7 % (ref 43.0–77.0)
Platelets: 199 10*3/uL (ref 150.0–400.0)
RBC: 4.52 Mil/uL (ref 3.87–5.11)
RDW: 13.1 % (ref 11.5–15.5)
WBC: 6.4 10*3/uL (ref 4.0–10.5)

## 2017-12-27 LAB — FOOD ALLERGY PROFILE
Allergen, Salmon, f41: <0.1 kU/L
CLASS: 0
CLASS: 0
CLASS: 0
CLASS: 0
CLASS: 0
CLASS: 0
CLASS: 0
CLASS: 0
CLASS: 0
CLASS: 0
CLASS: 0
CLASS: 0
CLASS: 0
Cashew IgE: <0.1 kU/L
Class: 0
Class: 0
Egg White IgE: <0.1 kU/L
Fish Cod: <0.1 kU/L
Peanut IgE: <0.1 kU/L
Scallop IgE: <0.1 kU/L
Shrimp IgE: <0.1 kU/L
Soybean IgE: <0.1 kU/L
Walnut: <0.1 kU/L
Wheat IgE: <0.1 kU/L

## 2017-12-27 LAB — SEDIMENTATION RATE: Sed Rate: 1 mm/hr (ref 0–20)

## 2017-12-27 LAB — VITAMIN D 25 HYDROXY (VIT D DEFICIENCY, FRACTURES): VITD: 41.55 ng/mL (ref 30.00–100.00)

## 2017-12-27 LAB — LIPASE: LIPASE: 6 U/L — AB (ref 11.0–59.0)

## 2017-12-27 LAB — AMYLASE: Amylase: 37 U/L (ref 27–131)

## 2017-12-27 LAB — INTERPRETATION:

## 2017-12-30 DIAGNOSIS — K589 Irritable bowel syndrome without diarrhea: Secondary | ICD-10-CM | POA: Insufficient documentation

## 2017-12-30 NOTE — Assessment & Plan Note (Signed)
Symptoms improved since avoiding gluten but not gone. She notes sometimes she notes fat on stool. Pancreatic enzyme levels normal and allergy testing negative. She will continue to avoid offending foods and GMO foods, take a daily probiotic and fiber supplement and consider GI referral if symptoms worsen.

## 2018-05-07 ENCOUNTER — Other Ambulatory Visit: Payer: Self-pay | Admitting: Obstetrics and Gynecology

## 2018-05-07 DIAGNOSIS — Z1231 Encounter for screening mammogram for malignant neoplasm of breast: Secondary | ICD-10-CM

## 2018-05-29 DIAGNOSIS — K5900 Constipation, unspecified: Secondary | ICD-10-CM | POA: Diagnosis not present

## 2018-05-29 DIAGNOSIS — Z124 Encounter for screening for malignant neoplasm of cervix: Secondary | ICD-10-CM | POA: Diagnosis not present

## 2018-05-29 DIAGNOSIS — K921 Melena: Secondary | ICD-10-CM | POA: Diagnosis not present

## 2018-05-29 DIAGNOSIS — Z01419 Encounter for gynecological examination (general) (routine) without abnormal findings: Secondary | ICD-10-CM | POA: Diagnosis not present

## 2018-05-29 LAB — HM PAP SMEAR: HM Pap smear: NEGATIVE

## 2018-06-05 ENCOUNTER — Inpatient Hospital Stay: Admission: RE | Admit: 2018-06-05 | Payer: 59 | Source: Ambulatory Visit

## 2018-07-18 ENCOUNTER — Ambulatory Visit
Admission: RE | Admit: 2018-07-18 | Discharge: 2018-07-18 | Disposition: A | Discharge status: Home / Self Care | Payer: 59 | Source: Ambulatory Visit | Attending: Obstetrics and Gynecology | Admitting: Obstetrics and Gynecology

## 2018-07-18 DIAGNOSIS — Z1231 Encounter for screening mammogram for malignant neoplasm of breast: Secondary | ICD-10-CM | POA: Diagnosis not present

## 2019-02-05 ENCOUNTER — Other Ambulatory Visit: Payer: Self-pay

## 2019-02-05 ENCOUNTER — Ambulatory Visit (INDEPENDENT_AMBULATORY_CARE_PROVIDER_SITE_OTHER): Payer: 59 | Admitting: Family Medicine

## 2019-02-05 DIAGNOSIS — L259 Unspecified contact dermatitis, unspecified cause: Secondary | ICD-10-CM

## 2019-02-05 DIAGNOSIS — T63301A Toxic effect of unspecified spider venom, accidental (unintentional), initial encounter: Secondary | ICD-10-CM | POA: Insufficient documentation

## 2019-02-05 DIAGNOSIS — E559 Vitamin D deficiency, unspecified: Secondary | ICD-10-CM | POA: Diagnosis not present

## 2019-02-05 MED ORDER — CALAMINE EX LOTN: 1.0000 "application " | TOPICAL_LOTION | CUTANEOUS | 0 refills | Status: DC | PRN

## 2019-02-05 MED ORDER — CETIRIZINE HCL 10 MG PO CAPS: 10.0000 mg | ORAL_CAPSULE | Freq: Every day | ORAL | 2 refills | Status: DC | PRN

## 2019-02-05 MED ORDER — CEPHALEXIN 500 MG PO CAPS: 500.0000 mg | ORAL_CAPSULE | Freq: Four times a day (QID) | ORAL | 0 refills | Status: DC

## 2019-02-05 MED ORDER — DIPHENHYDRAMINE HCL 25 MG PO TABS: 25.0000 mg | ORAL_TABLET | Freq: Every evening | ORAL | 0 refills | Status: DC | PRN

## 2019-02-05 MED ORDER — CEPHALEXIN 500 MG PO CAPS: 500.0000 mg | ORAL_CAPSULE | Freq: Three times a day (TID) | ORAL | 0 refills | Status: DC

## 2019-02-05 MED ORDER — TRIAMCINOLONE ACETONIDE 0.1 % EX CREA: 1.0000 "application " | TOPICAL_CREAM | Freq: Two times a day (BID) | CUTANEOUS | 0 refills | Status: DC

## 2019-02-05 NOTE — Assessment & Plan Note (Signed)
Bilateral arms with some secondary cellulitis vs spider bite. Started on Keflex and Triamcinolone ointment to use bid, Witch hazel and deep cleaning of everything she used when she did yard work. Likely poison ivy contact.

## 2019-02-05 NOTE — Assessment & Plan Note (Signed)
Supplement and monitor 
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
## 2019-03-01 ENCOUNTER — Encounter: Payer: Self-pay | Admitting: Family Medicine

## 2019-03-01 ENCOUNTER — Telehealth: Payer: 59 | Admitting: Family

## 2019-03-01 DIAGNOSIS — R319 Hematuria, unspecified: Secondary | ICD-10-CM

## 2019-03-01 DIAGNOSIS — R300 Dysuria: Secondary | ICD-10-CM

## 2019-03-01 NOTE — Progress Notes (Signed)
Thank you for the details you included in the comment boxes. Those details are very helpful in determining the best course of treatment for you and help us to provide the best care.  Due to your symptoms, we must perform a physical exam and lab work for safety reasons to ensure the infection has not moved up your urinary tract. This is the standard of care and cannot be performed remotely.   Based on what you shared with me, I feel your condition warrants further evaluation and I recommend that you be seen for a face to face office visit.     NOTE: If you entered your credit card information for this eVisit, you will not be charged. You may see a "hold" on your card for the $35 but that hold will drop off and you will not have a charge processed.  If you are having a true medical emergency please call 911.  If you need an urgent face to face visit, [facility] has four urgent care centers for your convenience.    PLEASE NOTE: THE [facility] LOCATIONS AND URGENT CARE CLINICS DO NOT HAVE THE TESTING FOR CORONAVIRUS COVID19 AVAILABLE.  IF YOU FEEL YOU NEED THIS TEST YOU MUST GO TO A TRIAGE LOCATION AT ONE OF THE HOSPITAL EMERGENCY DEPARTMENTS   [email or web address] to reserve your spot online an avoid wait times  [facility] [address], [address] [place], [place] [address] Modified hours of operation: Monday-Friday, 12 PM to 6 PM  Saturday & Sunday 10 AM to 4 PM *Across the street from [employer]  [facility] (New Address!) [address], [address] [place], [place] [address] *Just off [address], across the road from [employer]* Modified hours of operation: Monday-Friday, 12 PM to 6 PM  Closed Saturday & Sunday  [facility]'s modified hours of operation will be in effect from May 1 until May 31   The following sites will take your insurance:  . [facility]  [phone] Get Driving Directions Find a Provider at this  Location  [address] [place], [place] [address] . 10 am to 8 pm Monday-Friday . 12 pm to 8 pm Saturday-Sunday   . [facility]  [phone] Get Driving Directions Find a Provider at this Location  [address], [address] [place], [place] [address] . 8 am to 8 pm Monday-Friday . 9 am to 6 pm Saturday . 11 am to 6 pm Sunday   . [facility] [place]  [phone] Get Driving Directions  [address] [place],  [address] . 8 am to 8 pm Monday-Friday . 8 am to 4 pm Saturday-Sunday   Your e-visit answers were reviewed by a board certified advanced clinical practitioner to complete your personal care plan.  Thank you for using e-Visits.  

## 2019-03-04 ENCOUNTER — Other Ambulatory Visit: Payer: Self-pay | Admitting: Family Medicine

## 2019-03-04 MED ORDER — NITROFURANTOIN MONOHYD MACRO 100 MG PO CAPS: 100.0000 mg | ORAL_CAPSULE | Freq: Two times a day (BID) | ORAL | 0 refills | Status: DC

## 2019-03-04 NOTE — Telephone Encounter (Signed)
Dr Abner Greenspan -- pt has E-visit on 03/01/19 and was told she needed an in person visit. Is it ok to bring pt in to the office for evaluation?

## 2019-03-06 ENCOUNTER — Telehealth: Payer: Self-pay

## 2019-03-06 NOTE — Telephone Encounter (Signed)
Copied from CRM [id number]. Topic: General - Other >> Mar 01, 2019  1:36 PM Dalphine Handing A wrote: Patient would like a callback from Dr. Elby Showers nurse in regards to medication for her visit that she had on today.

## 2019-03-06 NOTE — Telephone Encounter (Signed)
Called/left message for patient to call back.

## 2020-04-27 ENCOUNTER — Other Ambulatory Visit: Payer: Self-pay | Admitting: Obstetrics and Gynecology

## 2020-04-27 DIAGNOSIS — Z1231 Encounter for screening mammogram for malignant neoplasm of breast: Secondary | ICD-10-CM

## 2020-07-21 ENCOUNTER — Ambulatory Visit
Admission: RE | Admit: 2020-07-21 | Discharge: 2020-07-21 | Disposition: A | Discharge status: Home / Self Care | Payer: 59 | Source: Ambulatory Visit | Attending: Obstetrics and Gynecology | Admitting: Obstetrics and Gynecology

## 2020-07-21 ENCOUNTER — Other Ambulatory Visit: Payer: Self-pay

## 2020-07-21 DIAGNOSIS — Z1231 Encounter for screening mammogram for malignant neoplasm of breast: Secondary | ICD-10-CM

## 2021-03-10 ENCOUNTER — Encounter: Payer: Self-pay | Admitting: Medical

## 2021-03-10 ENCOUNTER — Telehealth (INDEPENDENT_AMBULATORY_CARE_PROVIDER_SITE_OTHER): Payer: 59 | Admitting: Medical

## 2021-03-10 ENCOUNTER — Other Ambulatory Visit: Payer: Self-pay

## 2021-03-10 VITALS — BP 126/91 | HR 77

## 2021-03-10 DIAGNOSIS — R0981 Nasal congestion: Secondary | ICD-10-CM

## 2021-03-10 DIAGNOSIS — R059 Cough, unspecified: Secondary | ICD-10-CM | POA: Diagnosis not present

## 2021-03-10 DIAGNOSIS — J0100 Acute maxillary sinusitis, unspecified: Secondary | ICD-10-CM

## 2021-03-10 MED ORDER — AZITHROMYCIN 250 MG PO TABS: ORAL_TABLET | ORAL | 0 refills | Status: AC

## 2021-03-10 MED ORDER — FLUTICASONE PROPIONATE 50 MCG/ACT NA SUSP: 2.0000 | Freq: Every day | NASAL | 1 refills | Status: DC

## 2021-03-10 MED ORDER — BENZONATATE 100 MG PO CAPS: 100.0000 mg | ORAL_CAPSULE | Freq: Three times a day (TID) | ORAL | 0 refills | Status: DC | PRN

## 2021-03-10 NOTE — Progress Notes (Signed)
   Subjective:    Patient ID: [name], female    DOB: Aug 22, 1976, 45 y.o.   MRN: [id number]  HPI  Virtual Visit via Video Note  I connected with Bufford Spikes on 03/10/21 at 10:00 AM EDT by a video enabled telemedicine application and verified that I am speaking with the correct person using two identifiers.  Location: Patient: home Provider: office  partcipants- pt and myself.  Pt did not check her bp or 02 sat%. She will check and send me my chart message.   I discussed the limitations of evaluation and management by telemedicine and the availability of in person appointments. The patient expressed understanding and agreed to proceed.  History of Present Illness:  Pt states had cold like symptoms 2 weeks ago(nasal congestion, sneezing and runny nose) Never had fever. No sob or wheezing. Pt states feels like has phlem in back of her throat. States will cough up some clear mucus at times. Pt has some rt maxillary sinus pressure.  Pt tested negative for covid with otc test at onset.   Pt has tried dayquil and mucinex.  Pt has not tried any anithistamine or nasal spray. Pt has had cough entire time. Occasional scant productive.  Hx of allergies in spring time but not typically in the summer.  Pt has been vaccinate against covid 3 times and got covid in January.     Observations/Objective:  General-no acute distress, pleasant, oriented. Lungs- on inspection lungs appear unlabored. Neck- no tracheal deviation or jvd on inspection. Neuro- gross motor function appears intact. heent- rt maxillary tenderness on palpation.  Assessment and Plan: 2 weeks or more of initially upper respiratory infection versus allergy type symptoms.  Tested negative for COVID but symptoms are still persisting.  At this point prescribing nasal congestion, right-sided maxillary sinus pressure and occasional productive cough.  Will prescribe azithromycin antibiotic for possible sinus infection.   Prescribed benzonatate for cough and Flonase nasal spray for nasal congestion.  If signs and symptoms persist despite above measures then explained retest for COVID.  If negative then schedule an office appointment as physical exam may be helpful in directing further work-up/treatment.  Follow-up in 7 to 10 days or as needed.  Esperanza Richters, PA-C  Follow Up Instructions:    I discussed the assessment and treatment plan with the patient. The patient was provided an opportunity to ask questions and all were answered. The patient agreed with the plan and demonstrated an understanding of the instructions.   The patient was advised to call back or seek an in-person evaluation if the symptoms worsen or if the condition fails to improve as anticipated.  Time spent with patient today was 20  minutes which consisted of chart revdiew, discussing diagnosis, work up treatment and documentation.   Esperanza Richters, PA-C   Review of Systems  Constitutional: Negative for chills, fatigue and fever.  HENT: Positive for congestion, postnasal drip, sinus pressure and sinus pain.   Respiratory: Positive for cough. Negative for shortness of breath and wheezing.   Cardiovascular: Negative for chest pain.  Gastrointestinal: Negative for abdominal pain and constipation.  Genitourinary: Negative for frequency.  Musculoskeletal: Negative for back pain and myalgias.  Neurological: Negative for dizziness, seizures and headaches.  Hematological: Negative for adenopathy.  Psychiatric/Behavioral: Negative for behavioral problems.       Objective:   Physical Exam        Assessment & Plan:

## 2021-03-10 NOTE — Patient Instructions (Signed)
2 weeks or more of initially upper respiratory infection versus allergy type symptoms.  Tested negative for COVID but symptoms are still persisting.  At this point prescribing nasal congestion, right-sided maxillary sinus pressure and occasional productive cough.  Will prescribe azithromycin antibiotic for possible sinus infection.  Prescribed benzonatate for cough and Flonase nasal spray for nasal congestion.  If signs and symptoms persist despite above measures then explained retest for COVID.  If negative then schedule an office appointment as physical exam may be helpful in directing further work-up/treatment.  Follow-up in 7 to 10 days or as needed.

## 2021-09-02 ENCOUNTER — Ambulatory Visit
Admission: RE | Admit: 2021-09-02 | Discharge: 2021-09-02 | Disposition: A | Discharge status: Home / Self Care | Payer: 59 | Source: Ambulatory Visit | Attending: Emergency Medicine | Admitting: Emergency Medicine

## 2021-09-02 ENCOUNTER — Other Ambulatory Visit: Payer: Self-pay

## 2021-09-02 VITALS — BP 123/88 | HR 84 | Temp 98.2°F | Resp 18

## 2021-09-02 DIAGNOSIS — R051 Acute cough: Secondary | ICD-10-CM

## 2021-09-02 DIAGNOSIS — Z20828 Contact with and (suspected) exposure to other viral communicable diseases: Secondary | ICD-10-CM

## 2021-09-02 DIAGNOSIS — R0981 Nasal congestion: Secondary | ICD-10-CM | POA: Diagnosis not present

## 2021-09-02 MED ORDER — OSELTAMIVIR PHOSPHATE 75 MG PO CAPS: 75.0000 mg | ORAL_CAPSULE | Freq: Two times a day (BID) | ORAL | 0 refills | Status: DC

## 2021-09-02 NOTE — Discharge Instructions (Signed)
The results of your other respiratory virus testing, if any, will be posted to your MyChart.  If any of the results are positive, you will be contacted by phone and further recommendations such as antiviral medications or a note to extend your time out of work/school, if needed, will be provided to you.  Please feel free to begin your prescriptions for Tamiflu given our concern for possible exposure to influenza.  Please remain home from work, school, public places until you have been fever free for 24 hours without the use of antifever medications such as Tylenol or ibuprofen.  Conservative care is recommended at this time.  This includes rest, pushing clear fluids and activity as tolerated.  You may also noticed that your appetite is reduced, this is okay as long as they are drinking plenty of clear fluids.  Acetaminophen (Tylenol): This is a good fever reducer.  If there body temperature rises above 101.5 as measured with a thermometer, it is recommended that you give them 1,000 mg every 6-8 hours until they are temperature falls below 101.5, please not take more than 3,000 mg of acetaminophen either as a separate medication or as in ingredient in an over-the-counter cold/flu preparation within a 24-hour period  Ibuprofen  (Advil, Motrin): This is a good anti-inflammatory medication which addresses aches and pains and, to some degree, congestion in the nasal passages.  I recommend giving between 400 to 600 mg every 6-8 hours as needed.  Pseudoephedrine (Sudafed): This is a decongestant.  This medication has to be purchased from the pharmacist counter, I recommend giving 2 tablets, 60 mg, 2-3 times a day as needed to relieve runny nose and sinus drainage.  Guaifenesin (Robitussin, Mucinex): This is an expectorant.  This helps break up chest congestion and loosen up thick nasal drainage making phlegm and drainage more liquid and therefore easier to remove.  I recommend being 400 mg three times daily as  needed.  Dextromethorphan (any cough medicine with the letters "DM" added to it's name such as Robitussin DM): This is a cough suppressant.  This is often recommended to be taken at nighttime to suppress cough and help children sleep.  Give dosage as directed on the bottle.   Chloraseptic Throat Spray: Spray 5 sprays into affected area every 2 hours, hold for 15 seconds and either swallow or spit it out.  This is a excellent numbing medication because it is a spray, you can put it right where you needed and so sucking on a lozenge and numbing your entire mouth.  Based on my physical exam findings and the history provided  today, I do not see any evidence of bacterial infection therefore treatment with antibiotics would be of no benefit.  Please follow-up within the next 3 to 5 days either with your primary care provider or urgent care if your symptoms do not resolve.  If you do not have a primary care provider, we will assist you in finding one.
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
## 2021-09-02 NOTE — ED Triage Notes (Signed)
Pt reports having cough, congestion and sore throat that started yesterday.

## 2021-09-03 LAB — COVID-19, FLU A+B NAA
Influenza A, NAA: NOT DETECTED
Influenza B, NAA: NOT DETECTED
SARS-CoV-2, NAA: NOT DETECTED

## 2021-09-09 ENCOUNTER — Telehealth (INDEPENDENT_AMBULATORY_CARE_PROVIDER_SITE_OTHER): Payer: 59 | Admitting: Medical

## 2021-09-09 VITALS — HR 90

## 2021-09-09 DIAGNOSIS — R051 Acute cough: Secondary | ICD-10-CM | POA: Diagnosis not present

## 2021-09-09 DIAGNOSIS — R0981 Nasal congestion: Secondary | ICD-10-CM

## 2021-09-09 DIAGNOSIS — J0100 Acute maxillary sinusitis, unspecified: Secondary | ICD-10-CM | POA: Diagnosis not present

## 2021-09-09 MED ORDER — AZITHROMYCIN 250 MG PO TABS: ORAL_TABLET | ORAL | 0 refills | Status: AC

## 2021-09-09 MED ORDER — BENZONATATE 100 MG PO CAPS: 100.0000 mg | ORAL_CAPSULE | Freq: Three times a day (TID) | ORAL | 0 refills | Status: DC | PRN

## 2021-09-09 MED ORDER — FLUTICASONE PROPIONATE 50 MCG/ACT NA SUSP: 2.0000 | Freq: Every day | NASAL | 1 refills | Status: DC

## 2021-09-09 NOTE — Progress Notes (Signed)
   Subjective:    Patient ID: [name], female    DOB: 1976/07/17, 45 y.o.   MRN: [id number]  HPI Virtual Visit via Video Note  I connected with Bufford Spikes on 09/09/21 at 11:20 AM EST by a video enabled telemedicine application and verified that I am speaking with the correct person using two identifiers.  Location: Patient: home Provider: home   I discussed the limitations of evaluation and management by telemedicine and the availability of in person appointments. The patient expressed understanding and agreed to proceed.  History of Present Illness:   Pt states 10 of nasal congestion, blowing nose and coughing up some mucus.   One week ago went to UC. Flu and covid test negative. Pt early on past week had low level fever. No myalgia.  No sob or wheezing.    Observations/Objective:  General-no acute distress, pleasant, oriented. Lungs- on inspection lungs appear unlabored. Neck- no tracheal deviation or jvd on inspection. Neuro- gross motor function appears intact.  Heent- maxillary sinus pressure to palpation.  Assessment and Plan: Patient Instructions  Sinus infection following upper respiratory infection.  Last week when symptoms started flu and COVID test negative.  Now having maxillary sinus pressure.  Prescribing azithromycin antibiotic, benzonatate for cough and Flonase for nasal congestion.  Signs and symptoms persist or worsen notify us.  If symptoms not resolving by middle of next week then offer in person office visit.  Follow-up as needed as well.   Esperanza Richters, PA-C    Time spent with patient today was 20  minutes which consisted of chart revdiew, discussing diagnosis, work up treatment and documentation.    Follow Up Instructions:    I discussed the assessment and treatment plan with the patient. The patient was provided an opportunity to ask questions and all were answered. The patient agreed with the plan and demonstrated an  understanding of the instructions.   The patient was advised to call back or seek an in-person evaluation if the symptoms worsen or if the condition fails to improve as anticipated.     Esperanza Richters, PA-C    Review of Systems  Constitutional:  Negative for chills, fatigue and fever.  HENT:  Positive for congestion, sinus pressure and sinus pain.   Respiratory:  Negative for cough, chest tightness, shortness of breath and wheezing.   Cardiovascular:  Negative for chest pain and palpitations.  Gastrointestinal:  Negative for abdominal pain.  Genitourinary:  Negative for dysuria, flank pain and frequency.  Musculoskeletal:  Negative for back pain, joint swelling and myalgias.  Skin:  Negative for rash.  Neurological:  Negative for dizziness and headaches.  Hematological:  Negative for adenopathy. Does not bruise/bleed easily.  Psychiatric/Behavioral:  Negative for agitation and confusion.       Objective:   Physical Exam        Assessment & Plan:

## 2021-09-09 NOTE — Patient Instructions (Signed)
Sinus infection following upper respiratory infection.  Last week when symptoms started flu and COVID test negative.  Now having maxillary sinus pressure.  Prescribing azithromycin antibiotic, benzonatate for cough and Flonase for nasal congestion.  Signs and symptoms persist or worsen notify us.  If symptoms not resolving by middle of next week then offer in person office visit.  Follow-up as needed as well.

## 2021-09-15 ENCOUNTER — Telehealth (HOSPITAL_BASED_OUTPATIENT_CLINIC_OR_DEPARTMENT_OTHER): Payer: Self-pay

## 2021-09-29 ENCOUNTER — Other Ambulatory Visit: Payer: Self-pay | Admitting: Family Medicine

## 2021-09-29 DIAGNOSIS — Z1231 Encounter for screening mammogram for malignant neoplasm of breast: Secondary | ICD-10-CM

## 2021-10-11 ENCOUNTER — Encounter (HOSPITAL_BASED_OUTPATIENT_CLINIC_OR_DEPARTMENT_OTHER): Payer: 59

## 2021-10-11 DIAGNOSIS — Z1231 Encounter for screening mammogram for malignant neoplasm of breast: Secondary | ICD-10-CM

## 2021-10-26 ENCOUNTER — Other Ambulatory Visit: Payer: Self-pay

## 2021-10-26 ENCOUNTER — Ambulatory Visit
Admission: RE | Admit: 2021-10-26 | Discharge: 2021-10-26 | Disposition: A | Discharge status: Home / Self Care | Payer: 59 | Source: Ambulatory Visit | Attending: Family Medicine | Admitting: Family Medicine

## 2021-10-26 DIAGNOSIS — Z1231 Encounter for screening mammogram for malignant neoplasm of breast: Secondary | ICD-10-CM

## 2021-10-27 ENCOUNTER — Other Ambulatory Visit: Payer: Self-pay | Admitting: Family Medicine

## 2021-10-27 DIAGNOSIS — N6459 Other signs and symptoms in breast: Secondary | ICD-10-CM

## 2021-10-29 ENCOUNTER — Ambulatory Visit
Admission: RE | Admit: 2021-10-29 | Discharge: 2021-10-29 | Disposition: A | Discharge status: Home / Self Care | Payer: 59 | Source: Ambulatory Visit | Attending: Family Medicine | Admitting: Family Medicine

## 2021-10-29 DIAGNOSIS — N6459 Other signs and symptoms in breast: Secondary | ICD-10-CM

## 2022-03-21 ENCOUNTER — Encounter: Payer: 59 | Admitting: Family Medicine

## 2022-03-22 ENCOUNTER — Encounter: Payer: 59 | Admitting: Family Medicine

## 2022-04-06 ENCOUNTER — Encounter: Payer: 59 | Admitting: Family

## 2022-04-08 ENCOUNTER — Telehealth: Payer: Self-pay | Admitting: Family

## 2022-04-08 ENCOUNTER — Encounter: Payer: Self-pay | Admitting: Family

## 2022-04-08 ENCOUNTER — Ambulatory Visit (INDEPENDENT_AMBULATORY_CARE_PROVIDER_SITE_OTHER): Payer: 59 | Admitting: Family

## 2022-04-08 VITALS — BP 116/79 | HR 67 | Temp 98.3°F | Resp 16 | Ht 63.5 in | Wt 158.0 lb

## 2022-04-08 DIAGNOSIS — T8332XA Displacement of intrauterine contraceptive device, initial encounter: Secondary | ICD-10-CM

## 2022-04-08 DIAGNOSIS — Z23 Encounter for immunization: Secondary | ICD-10-CM

## 2022-04-08 DIAGNOSIS — Z1211 Encounter for screening for malignant neoplasm of colon: Secondary | ICD-10-CM

## 2022-04-08 DIAGNOSIS — M7662 Achilles tendinitis, left leg: Secondary | ICD-10-CM | POA: Diagnosis not present

## 2022-04-08 DIAGNOSIS — Z30431 Encounter for routine checking of intrauterine contraceptive device: Secondary | ICD-10-CM

## 2022-04-08 DIAGNOSIS — F418 Other specified anxiety disorders: Secondary | ICD-10-CM | POA: Diagnosis not present

## 2022-04-08 DIAGNOSIS — Z0000 Encounter for general adult medical examination without abnormal findings: Secondary | ICD-10-CM | POA: Diagnosis not present

## 2022-04-08 DIAGNOSIS — E559 Vitamin D deficiency, unspecified: Secondary | ICD-10-CM

## 2022-04-08 MED ORDER — BUPROPION HCL ER (XL) 150 MG PO TB24: 150.0000 mg | ORAL_TABLET | Freq: Every day | ORAL | 1 refills | Status: DC
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
## 2022-04-08 NOTE — Telephone Encounter (Signed)
[facility] and send for scanning/update.

## 2022-04-08 NOTE — Assessment & Plan Note (Signed)
Improved on wellbutrin xl 150mg  Will continue.

## 2022-04-09 ENCOUNTER — Telehealth: Payer: Self-pay | Admitting: Family

## 2022-04-09 DIAGNOSIS — Z30431 Encounter for routine checking of intrauterine contraceptive device: Secondary | ICD-10-CM | POA: Insufficient documentation

## 2022-04-09 DIAGNOSIS — E559 Vitamin D deficiency, unspecified: Secondary | ICD-10-CM

## 2022-04-09 LAB — VITAMIN D 25 HYDROXY (VIT D DEFICIENCY, FRACTURES): Vit D, 25-Hydroxy: 23 ng/mL — ABNORMAL LOW (ref 30–100)

## 2022-04-09 MED ORDER — VITAMIN D (ERGOCALCIFEROL) 1.25 MG (50000 UNIT) PO CAPS: 50000.0000 [IU] | ORAL_CAPSULE | ORAL | 0 refills | Status: DC

## 2022-04-09 NOTE — Assessment & Plan Note (Signed)
New. Refer to Sports med for further evaluation.

## 2022-04-09 NOTE — Telephone Encounter (Signed)
Vitamin D level is low.  Advise patient to begin vit D 50000 units once weekly for 12 weeks, then repeat vit D level (dx Vit D deficiency).     

## 2022-04-09 NOTE — Assessment & Plan Note (Signed)
Refer for colo. Td today. Mammo and pap up to date per GYN. Discussed healthy diet and exercise.

## 2022-04-11 ENCOUNTER — Telehealth (HOSPITAL_BASED_OUTPATIENT_CLINIC_OR_DEPARTMENT_OTHER): Payer: Self-pay

## 2022-04-11 NOTE — Telephone Encounter (Signed)
Result printed and abstracted

## 2022-04-11 NOTE — Telephone Encounter (Signed)
Patient advised of results and new prescription. She will call back to schedule Vit D lab appt. Orders entered as future, to be done in 3 months.

## 2022-04-18 ENCOUNTER — Encounter: Payer: 59 | Admitting: Family Medicine

## 2022-04-18 ENCOUNTER — Telehealth (HOSPITAL_BASED_OUTPATIENT_CLINIC_OR_DEPARTMENT_OTHER): Payer: Self-pay

## 2022-05-10 ENCOUNTER — Ambulatory Visit: Payer: Self-pay

## 2022-05-10 ENCOUNTER — Encounter: Payer: Self-pay | Admitting: Family Medicine

## 2022-05-10 ENCOUNTER — Ambulatory Visit (INDEPENDENT_AMBULATORY_CARE_PROVIDER_SITE_OTHER): Payer: 59 | Admitting: Family Medicine

## 2022-05-10 VITALS — BP 108/80 | Ht 63.5 in | Wt 158.0 lb

## 2022-05-10 DIAGNOSIS — M6788 Other specified disorders of synovium and tendon, other site: Secondary | ICD-10-CM | POA: Diagnosis not present

## 2022-05-10 DIAGNOSIS — M7660 Achilles tendinitis, unspecified leg: Secondary | ICD-10-CM
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
## 2022-05-10 NOTE — Patient Instructions (Signed)
Nice to meet you Please try wearing an elevated shoe  Please try the exercises   Please send me a message in MyChart with any questions or updates.  Please see me back in 4 weeks.   --Dr. Jordan Likes

## 2022-05-10 NOTE — Assessment & Plan Note (Signed)
Acute on chronic in nature.  May have started after initial course of antibiotics. -Counseled on home exercise therapy and supportive care. -Counseled on heel lift. -Could consider shockwave therapy or nitro patches.

## 2022-05-12 ENCOUNTER — Telehealth (HOSPITAL_BASED_OUTPATIENT_CLINIC_OR_DEPARTMENT_OTHER): Payer: Self-pay

## 2022-05-13 ENCOUNTER — Encounter: Payer: Self-pay | Admitting: Gastroenterology

## 2022-06-30 ENCOUNTER — Encounter: Payer: 59 | Admitting: Gastroenterology

## 2022-07-18 ENCOUNTER — Emergency Department (HOSPITAL_BASED_OUTPATIENT_CLINIC_OR_DEPARTMENT_OTHER)
Admission: EM | Admit: 2022-07-18 | Discharge: 2022-07-18 | Disposition: A | Discharge status: Home / Self Care | Payer: 59 | Attending: Emergency Medicine | Admitting: Emergency Medicine

## 2022-07-18 ENCOUNTER — Telehealth: Payer: 59 | Admitting: Emergency Medicine

## 2022-07-18 ENCOUNTER — Emergency Department (HOSPITAL_BASED_OUTPATIENT_CLINIC_OR_DEPARTMENT_OTHER): Payer: 59

## 2022-07-18 ENCOUNTER — Other Ambulatory Visit: Payer: Self-pay

## 2022-07-18 ENCOUNTER — Encounter (HOSPITAL_BASED_OUTPATIENT_CLINIC_OR_DEPARTMENT_OTHER): Payer: Self-pay | Admitting: Emergency Medicine

## 2022-07-18 ENCOUNTER — Encounter: Payer: Self-pay | Admitting: Family Medicine

## 2022-07-18 DIAGNOSIS — K8020 Calculus of gallbladder without cholecystitis without obstruction: Secondary | ICD-10-CM

## 2022-07-18 DIAGNOSIS — E039 Hypothyroidism, unspecified: Secondary | ICD-10-CM | POA: Insufficient documentation

## 2022-07-18 DIAGNOSIS — E559 Vitamin D deficiency, unspecified: Secondary | ICD-10-CM

## 2022-07-18 DIAGNOSIS — R1013 Epigastric pain: Secondary | ICD-10-CM | POA: Diagnosis present

## 2022-07-18 DIAGNOSIS — Z0000 Encounter for general adult medical examination without abnormal findings: Secondary | ICD-10-CM

## 2022-07-18 DIAGNOSIS — K8080 Other cholelithiasis without obstruction: Secondary | ICD-10-CM | POA: Insufficient documentation

## 2022-07-18 HISTORY — DX: Anxiety disorder, unspecified: F41.9

## 2022-07-18 LAB — CBC WITH DIFFERENTIAL/PLATELET
Abs Immature Granulocytes: 0.02 10*3/uL (ref 0.00–0.07)
Basophils Absolute: 0 10*3/uL (ref 0.0–0.1)
Basophils Relative: 1 %
Eosinophils Absolute: 0 10*3/uL (ref 0.0–0.5)
Eosinophils Relative: 1 %
HCT: 41.6 % (ref 36.0–46.0)
Hemoglobin: 13.9 g/dL (ref 12.0–15.0)
Immature Granulocytes: 0 %
Lymphocytes Relative: 26 %
Lymphs Abs: 1.7 10*3/uL (ref 0.7–4.0)
MCH: 28.8 pg (ref 26.0–34.0)
MCHC: 33.4 g/dL (ref 30.0–36.0)
MCV: 86.3 fL (ref 80.0–100.0)
Monocytes Absolute: 0.4 10*3/uL (ref 0.1–1.0)
Monocytes Relative: 5 %
Neutro Abs: 4.6 10*3/uL (ref 1.7–7.7)
Neutrophils Relative %: 67 %
Platelets: 219 10*3/uL (ref 150–400)
RBC: 4.82 MIL/uL (ref 3.87–5.11)
RDW: 12.1 % (ref 11.5–15.5)
WBC: 6.8 10*3/uL (ref 4.0–10.5)
nRBC: 0 % (ref 0.0–0.2)

## 2022-07-18 LAB — BASIC METABOLIC PANEL
Anion gap: 6 (ref 5–15)
BUN: 12 mg/dL (ref 6–20)
CO2: 26 mmol/L (ref 22–32)
Calcium: 10.3 mg/dL (ref 8.9–10.3)
Chloride: 105 mmol/L (ref 98–111)
Creatinine, Ser: 0.62 mg/dL (ref 0.44–1.00)
GFR, Estimated: >60 mL/min (ref >60)
Glucose, Bld: 124 mg/dL — ABNORMAL HIGH (ref 70–99)
Potassium: 3.7 mmol/L (ref 3.5–5.1)
Sodium: 137 mmol/L (ref 135–145)

## 2022-07-18 LAB — LIPASE, BLOOD: Lipase: 35 U/L (ref 11–51)

## 2022-07-18 LAB — HEPATIC FUNCTION PANEL
ALT: 12 U/L (ref 0–44)
AST: 14 U/L — ABNORMAL LOW (ref 15–41)
Albumin: 4.3 g/dL (ref 3.5–5.0)
Alkaline Phosphatase: 46 U/L (ref 38–126)
Bilirubin, Direct: <0.1 mg/dL (ref 0.0–0.2)
Total Bilirubin: 0.2 mg/dL — ABNORMAL LOW (ref 0.3–1.2)
Total Protein: 7.5 g/dL (ref 6.5–8.1)

## 2022-07-18 MED ORDER — SODIUM CHLORIDE 0.9 % IV BOLUS
1000.0000 mL | Freq: Once | INTRAVENOUS | Status: AC
Start: 2022-07-18 — End: 2022-07-18
  Administered 2022-07-18: 1000 mL via INTRAVENOUS

## 2022-07-18 NOTE — Progress Notes (Signed)
Virtual Visit Consent   [name], you are scheduled for a virtual visit with a [facility] provider today. Just as with appointments in the office, your consent must be obtained to participate. Your consent will be active for this visit and any virtual visit you may have with one of our providers in the next 365 days. If you have a MyChart account, a copy of this consent can be sent to you electronically.  As this is a virtual visit, video technology does not allow for your provider to perform a traditional examination. This may limit your provider's ability to fully assess your condition. If your provider identifies any concerns that need to be evaluated in person or the need to arrange testing (such as labs, EKG, etc.), we will make arrangements to do so. Although advances in technology are sophisticated, we cannot ensure that it will always work on either your end or our end. If the connection with a video visit is poor, the visit may have to be switched to a telephone visit. With either a video or telephone visit, we are not always able to ensure that we have a secure connection.  By engaging in this virtual visit, you consent to the provision of healthcare and authorize for your insurance to be billed (if applicable) for the services provided during this visit. Depending on your insurance coverage, you may receive a charge related to this service.  I need to obtain your verbal consent now. Are you willing to proceed with your visit today? [name] has provided verbal consent on 07/18/2022 for a virtual visit (video or telephone). Roxy Horseman, PA-C  Date: 07/18/2022 7:58 AM  Virtual Visit via Video Note   I, Roxy Horseman, connected with  [name]  ([id number], [birth year]) on 07/18/22 at  8:00 AM EDT by a video-enabled telemedicine application and verified that I am speaking with the correct person using two identifiers.  Location: Patient: Virtual Visit Location  Patient: Home Provider: Virtual Visit Location Provider: Home   I discussed the limitations of evaluation and management by telemedicine and the availability of in person appointments. The patient expressed understanding and agreed to proceed.    History of Present Illness: [name] is a 46 y.o. who identifies as a female who was assigned female at birth, and is being seen today for epigastric abdominal pain.  States that she has been up all night with the pain.  Denies nausea or vomiting.  Reports hx of acid reflux.  Has tried advil, TUMs, and heating pad.  Denies any fever.  Moves into her back.  States that she had some wine with dinner this weekend.Marland Kitchen  HPI: HPI  Problems:  Patient Active Problem List   Diagnosis Date Noted   Achilles tendinosis 05/10/2022   IUD check up 04/09/2022   Tendonitis, Achilles, left 04/08/2022   Spider bite 02/05/2019   Contact dermatitis 02/05/2019   IBS (irritable bowel syndrome) 12/30/2017   Headache 07/25/2017   Preventative health care 09/29/2016   Goiter 08/30/2015   Tremor of left hand 08/30/2015   History of chicken pox 08/24/2015   History of shingles 08/24/2015   GERD (gastroesophageal reflux disease) 08/24/2015   Vitamin D deficiency 08/24/2015   Depression with anxiety 03/11/2012    Allergies: No Known Allergies Medications:  Current Outpatient Medications:    buPROPion (WELLBUTRIN XL) 150 MG 24 hr tablet, Take 1 tablet (150 mg total) by mouth daily., Disp: 90 tablet, Rfl: 1  levonorgestrel (MIRENA) 20 MCG/24HR IUD, 1 Intra Uterine Device (1 each total) by Intrauterine route once for 1 dose. January 30, 2017 placed, Disp: 1 each, Rfl: 0   Vitamin D, Ergocalciferol, (DRISDOL) 1.25 MG (50000 UNIT) CAPS capsule, Take 1 capsule (50,000 Units total) by mouth every 7 (seven) days., Disp: 12 capsule, Rfl: 0  Observations/Objective: Patient is well-developed, well-nourished in no acute distress.  Resting comfortably  at home.  Head is  normocephalic, atraumatic.  No labored breathing.  Speech is clear and coherent with logical content.  Patient is alert and oriented at baseline.  Upper abdominal tenderness with self exam/palpation  Assessment and Plan: 1. Epigastric pain  Refer to ED for labs and imaging of severe upper abdominal pain.  DDx: pancreatitis, cholecystitis/lithiasis, GERD.  Follow Up Instructions: I discussed the assessment and treatment plan with the patient. The patient was provided an opportunity to ask questions and all were answered. The patient agreed with the plan and demonstrated an understanding of the instructions.  A copy of instructions were sent to the patient via MyChart unless otherwise noted below.     The patient was advised to call back or seek an in-person evaluation if the symptoms worsen or if the condition fails to improve as anticipated.  Time:  I spent 7 minutes with the patient via telehealth technology discussing the above problems/concerns.    Montine Circle, PA-C

## 2022-07-18 NOTE — Discharge Instructions (Addendum)
Your symptoms are likely consistent with symptomatic cholelithiasis.  You have no evidence of infection of your gallbladder at this time.  With gallstones in your gallbladder that occasionally cause symptoms, general surgeons will often electively take your gallbladder out surgically.  Return to the emergency department for fevers, chills, severe right upper quadrant pain, nausea or vomiting and inability to tolerate oral intake as this could be an indication of infection of your gallbladder due to your gallstones.

## 2022-07-18 NOTE — ED Provider Notes (Signed)
[facility] EMERGENCY DEPARTMENT Provider Note   CSN: [id number] Arrival date & time: 07/18/22  0820     History  Chief Complaint  Patient presents with   Abdominal Pain    [name] is a 46 y.o. female.   Abdominal Pain    26 female with medical history significant for depression, shingles, GERD, hypothyroidism, anxiety who presents to the emergency department with epigastric abdominal pain.  Patient states that she was seen via an urgent care video visit earlier today and was advised to present to the emergency department for further evaluation.  She states that she has developed epigastric abdominal pain with some component of pain in the right upper quadrant.  She denies any nausea or vomiting.  She denies any diarrhea.  She denies any dysuria or increased urinary frequency.  Her last bowel movement was yesterday and was normal and she is currently passing gas.  She does state that she has a history of reflux but this feels different.  She states that she was up all night with the pain.  The pain in the right upper quadrant radiated to her back somewhat, no radiation to the shoulder.  She denies any fevers or chills.  Home Medications Prior to Admission medications   Medication Sig Start Date End Date Taking? Authorizing Provider  buPROPion (WELLBUTRIN XL) 150 MG 24 hr tablet Take 1 tablet (150 mg total) by mouth daily. 04/08/22   Sandford Craze, NP  levonorgestrel (MIRENA) 20 MCG/24HR IUD 1 Intra Uterine Device (1 each total) by Intrauterine route once for 1 dose. January 30, 2017 placed 10/05/17   Bradd Canary, MD  Vitamin D, Ergocalciferol, (DRISDOL) 1.25 MG (50000 UNIT) CAPS capsule Take 1 capsule (50,000 Units total) by mouth every 7 (seven) days. 04/09/22   Sandford Craze, NP      Allergies    Patient has no known allergies.    Review of Systems   Review of Systems  Gastrointestinal:  Positive for abdominal pain.  All other systems reviewed and are  negative.   Physical Exam Updated Vital Signs BP 122/83   Pulse 75   Temp 97.6 F (36.4 C)   Resp 18   Ht 5\' 3"  (1.6 m)   Wt 68 kg   SpO2 98%   BMI 26.57 kg/m  Physical Exam Vitals and nursing note reviewed.  Constitutional:      General: She is not in acute distress.    Appearance: She is well-developed.  HENT:     Head: Normocephalic and atraumatic.  Eyes:     Conjunctiva/sclera: Conjunctivae normal.  Cardiovascular:     Rate and Rhythm: Normal rate and regular rhythm.     Heart sounds: No murmur heard. Pulmonary:     Effort: Pulmonary effort is normal. No respiratory distress.     Breath sounds: Normal breath sounds.  Abdominal:     Palpations: Abdomen is soft.     Tenderness: There is abdominal tenderness in the right upper quadrant and epigastric area. There is no right CVA tenderness, left CVA tenderness, guarding or rebound. Negative signs include Murphy's sign and McBurney's sign.  Musculoskeletal:        General: No swelling.     Cervical back: Neck supple.  Skin:    General: Skin is warm and dry.     Capillary Refill: Capillary refill takes less than 2 seconds.  Neurological:     Mental Status: She is alert.  Psychiatric:  Mood and Affect: Mood normal.     ED Results / Procedures / Treatments   Labs (all labs ordered are listed, but only abnormal results are displayed) Labs Reviewed  BASIC METABOLIC PANEL - Abnormal; Notable for the following components:      Result Value   Glucose, Bld 124 (*)    All other components within normal limits  HEPATIC FUNCTION PANEL - Abnormal; Notable for the following components:   AST 14 (*)    Total Bilirubin 0.2 (*)    All other components within normal limits  CBC WITH DIFFERENTIAL/PLATELET  LIPASE, BLOOD    EKG EKG Interpretation  Date/Time:  Monday July 18 2022 08:59:49 EDT Ventricular Rate:  82 PR Interval:  148 QRS Duration: 83 QT Interval:  398 QTC Calculation: 465 R Axis:   13 Text  Interpretation: Sinus rhythm Ventricular premature complex Low voltage, precordial leads Consider anterior infarct Confirmed by Regan Lemming ([id number]) on 07/18/2022 9:26:05 AM  Radiology US Abdomen Limited RUQ (LIVER/GB)  Result Date: 07/18/2022 CLINICAL DATA:  Right upper quadrant pain on off for months. EXAM: ULTRASOUND ABDOMEN LIMITED RIGHT UPPER QUADRANT COMPARISON:  CT abdomen 10/11/2013 FINDINGS: Gallbladder: Cholelithiasis with the largest measuring 15 mm. No gallbladder wall thickening. No pericholecystic fluid. Negative sonographic Murphy sign. Ring down artifact on a few of the images suggesting adenomyomatosis. Common bile duct: Diameter: 5.8 mm Liver: No focal lesion identified. Within normal limits in parenchymal echogenicity. Portal vein is patent on color Doppler imaging with normal direction of blood flow towards the liver. Other: None. IMPRESSION: 1. Cholelithiasis without sonographic evidence of acute cholecystitis. Electronically Signed   By: Kathreen Devoid M.D.   On: 07/18/2022 10:02    Procedures Procedures    Medications Ordered in ED Medications  sodium chloride 0.9 % bolus 1,000 mL (1,000 mLs Intravenous New Bag/Given 07/18/22 0854)    ED Course/ Medical Decision Making/ A&P                           Medical Decision Making Amount and/or Complexity of Data Reviewed Labs: ordered. Radiology: ordered.    60 female with medical history significant for depression, shingles, GERD, hypothyroidism, anxiety who presents to the emergency department with epigastric abdominal pain.  Patient states that she was seen via an urgent care video visit earlier today and was advised to present to the emergency department for further evaluation.  She states that she has developed epigastric abdominal pain with some component of pain in the right upper quadrant.  She denies any nausea or vomiting.  She denies any diarrhea.  She denies any dysuria or increased urinary frequency.  Her last  bowel movement was yesterday and was normal and she is currently passing gas.  She does state that she has a history of reflux but this feels different.  She states that she was up all night with the pain.  The pain in the right upper quadrant radiated to her back somewhat, no radiation to the shoulder.  She denies any fevers or chills.  Vitals and telemetry on arrival: Afebrile, hemodynamically stable, mildly hypertensive BP 150/96, not tachycardic or tachypneic, saturating well on room air.  Sinus rhythm noted on cardiac telemetry  Pertinent exam findings include: Epigastric tenderness to palpation, right upper quadrant tenderness, negative Murphy sign, no rebound or guarding, negative CVA tenderness.  Differential Diagnosis: Most likely GERD, PUD, cholelithiasis, pancreatitis.  Also considered but feel less likely appendicitis, Bowel Obstruction, AAA, ACS,  pneumonia, pneumothorax, Pyelonephritis, Nephrolithiasis, cholecystitis, Shingles, Perforated Bowel or Ulcer, Diverticulosis/itis, Ischemic Mesentery, Inflammatory Bowel Disease, Strangulated/Incarcerated Hernia, gastritis.  EKG: Sinus rhythm, ventricular rate 82, PVC present, left foot contusion EKG, Q waves in the anterior leads of unclear significance, no STEMI.  Lab results include: Lipase normal, BMP normal, hepatic function panel with normal LFTs and normal T. bili, CBC without leukocytosis or anemia.  Imaging results include: Right upper quadrant ultrasound performed with evidence of cholelithiasis, upper limit of normal for CBD, no evidence of cholecystitis. IMPRESSION:  1. Cholelithiasis without sonographic evidence of acute  cholecystitis.    Course of tx has consisted of: Patient's pain and nausea had resolved on arrival to the emergency department.  She was administered a 1 L NaCl bolus for volume resuscitation.  Thought process: Patient symptoms have overall resolved.  Her she has evidence of cholelithiasis on right upper  quadrant ultrasound.  She has normal LFTs and T. bili, negative white blood cell count, is afebrile, well-appearing and currently asymptomatic.  Low concern for choledocholithiasis or cholecystitis at this time.  Patient to follow-up electively with general surgery for continued management.  Return precautions provided.  Presentation more consistent with symptomatic cholelithiasis.    Final Clinical Impression(s) / ED Diagnoses Final diagnoses:  Symptomatic cholelithiasis    Rx / DC Orders ED Discharge Orders          Ordered    Ambulatory referral to General Surgery        07/18/22 1022              Ernie Avena, MD 07/18/22 1026

## 2022-07-18 NOTE — ED Triage Notes (Signed)
Mid abd pain  upper sine yesterday , tender to touch she states  no n/v/d

## 2022-07-18 NOTE — Patient Instructions (Signed)
Based on what you shared with me, I feel your condition warrants further evaluation as soon as possible at an Emergency department.    NOTE: There will be NO CHARGE for this Visit   If you are having a true medical emergency please call 911.      Emergency Department-[facility]  Get Driving Directions  [phone]  [address]  [place], [place] [address]  Open 24/7/365      [facility] [address]  Get Driving Directions  [address]  [place], [place] [address]  Open 24/7/365    Emergency Department- [facility]  Get Driving Directions  [phone]  [address]  [place], [place] [address]  Open 24/7/365      [facility]  Get Driving Directions  [phone]  [address]  [place], [place] [address]  Open 24/7/365    [place]  Emergency Department- [facility]  Get Driving Directions  [phone]  [address]  [place], [place] [address]  Open 24/7/365    [place]  Emergency Department- [facility] [place]  Get Driving Directions  [address]  [place], [place] [address]  Open 24/7/365    [place]  Emergency Department- [facility]  Get Driving Directions  [phone]  [address]  [place], [place] [address]  Open 24/7/365    

## 2022-07-18 NOTE — ED Notes (Signed)
Ultrasound in room

## 2022-07-20 NOTE — Telephone Encounter (Signed)
Called pt was advised and lab appt made  Appt made with provider for 3 month

## 2022-10-06 ENCOUNTER — Other Ambulatory Visit: Payer: 59

## 2022-10-07 ENCOUNTER — Other Ambulatory Visit (INDEPENDENT_AMBULATORY_CARE_PROVIDER_SITE_OTHER): Payer: 59

## 2022-10-07 DIAGNOSIS — Z0000 Encounter for general adult medical examination without abnormal findings: Secondary | ICD-10-CM

## 2022-10-07 DIAGNOSIS — E559 Vitamin D deficiency, unspecified: Secondary | ICD-10-CM | POA: Diagnosis not present

## 2022-10-07 LAB — LIPID PANEL
Cholesterol: 202 mg/dL — ABNORMAL HIGH (ref 0–200)
HDL: 49.6 mg/dL (ref >39.00)
LDL Cholesterol: 130 mg/dL — ABNORMAL HIGH (ref 0–99)
NonHDL: 152.42
Total CHOL/HDL Ratio: 4
Triglycerides: 110 mg/dL (ref 0.0–149.0)
VLDL: 22 mg/dL (ref 0.0–40.0)

## 2022-10-07 LAB — LIPASE: Lipase: 6 U/L — ABNORMAL LOW (ref 11.0–59.0)

## 2022-10-07 LAB — CBC WITH DIFFERENTIAL/PLATELET
Basophils Absolute: 0 10*3/uL (ref 0.0–0.1)
Basophils Relative: 0.5 % (ref 0.0–3.0)
Eosinophils Absolute: 0 10*3/uL (ref 0.0–0.7)
Eosinophils Relative: 0.6 % (ref 0.0–5.0)
HCT: 40.2 % (ref 36.0–46.0)
Hemoglobin: 13.6 g/dL (ref 12.0–15.0)
Lymphocytes Relative: 37 % (ref 12.0–46.0)
Lymphs Abs: 2.3 10*3/uL (ref 0.7–4.0)
MCHC: 33.8 g/dL (ref 30.0–36.0)
MCV: 87.8 fl (ref 78.0–100.0)
Monocytes Absolute: 0.4 10*3/uL (ref 0.1–1.0)
Monocytes Relative: 6.4 % (ref 3.0–12.0)
Neutro Abs: 3.5 10*3/uL (ref 1.4–7.7)
Neutrophils Relative %: 55.5 % (ref 43.0–77.0)
Platelets: 228 10*3/uL (ref 150.0–400.0)
RBC: 4.58 Mil/uL (ref 3.87–5.11)
RDW: 13.2 % (ref 11.5–15.5)
WBC: 6.2 10*3/uL (ref 4.0–10.5)

## 2022-10-07 LAB — BASIC METABOLIC PANEL
BUN: 12 mg/dL (ref 6–23)
CO2: 27 mEq/L (ref 19–32)
Calcium: 9.1 mg/dL (ref 8.4–10.5)
Chloride: 104 mEq/L (ref 96–112)
Creatinine, Ser: 0.75 mg/dL (ref 0.40–1.20)
GFR: 93.97 mL/min (ref >60.00)
Glucose, Bld: 108 mg/dL — ABNORMAL HIGH (ref 70–99)
Potassium: 4.7 mEq/L (ref 3.5–5.1)
Sodium: 138 mEq/L (ref 135–145)

## 2022-10-07 LAB — TSH: TSH: 1.62 u[IU]/mL (ref 0.35–5.50)

## 2022-10-07 LAB — VITAMIN D 25 HYDROXY (VIT D DEFICIENCY, FRACTURES): VITD: 14.03 ng/mL — ABNORMAL LOW (ref 30.00–100.00)

## 2022-10-08 ENCOUNTER — Other Ambulatory Visit: Payer: Self-pay | Admitting: Medical

## 2022-10-10 ENCOUNTER — Other Ambulatory Visit: Payer: Self-pay

## 2022-10-10 MED ORDER — VITAMIN D (ERGOCALCIFEROL) 1.25 MG (50000 UNIT) PO CAPS: 50000.0000 [IU] | ORAL_CAPSULE | ORAL | 0 refills | Status: DC

## 2022-10-11 ENCOUNTER — Other Ambulatory Visit: Payer: Self-pay

## 2022-10-12 ENCOUNTER — Other Ambulatory Visit: Payer: Self-pay

## 2022-10-12 MED ORDER — VITAMIN D (ERGOCALCIFEROL) 1.25 MG (50000 UNIT) PO CAPS: 50000.0000 [IU] | ORAL_CAPSULE | ORAL | 4 refills | Status: DC

## 2022-10-13 LAB — VITAMIN D 1,25 DIHYDROXY
Vitamin D 1, 25 (OH)2 Total: 41 pg/mL (ref 18–72)
Vitamin D2 1, 25 (OH)2: 24 pg/mL
Vitamin D3 1, 25 (OH)2: 17 pg/mL

## 2022-10-20 DIAGNOSIS — F4322 Adjustment disorder with anxiety: Secondary | ICD-10-CM | POA: Insufficient documentation

## 2022-10-27 ENCOUNTER — Ambulatory Visit: Payer: 59 | Admitting: Family Medicine

## 2022-12-02 ENCOUNTER — Other Ambulatory Visit: Payer: Self-pay | Admitting: Family

## 2022-12-12 DIAGNOSIS — E785 Hyperlipidemia, unspecified: Secondary | ICD-10-CM | POA: Insufficient documentation

## 2022-12-12 NOTE — Assessment & Plan Note (Signed)
Supplement and monitor 

## 2022-12-12 NOTE — Assessment & Plan Note (Signed)
Doing well on Wellbutrin. 

## 2022-12-12 NOTE — Assessment & Plan Note (Signed)
Encourage heart healthy diet such as MIND or DASH diet, increase exercise, avoid trans fats, simple carbohydrates and processed foods, consider a krill or fish or flaxseed oil cap daily.  °

## 2022-12-13 ENCOUNTER — Ambulatory Visit (INDEPENDENT_AMBULATORY_CARE_PROVIDER_SITE_OTHER): Payer: 59 | Admitting: Family Medicine

## 2022-12-13 ENCOUNTER — Encounter: Payer: Self-pay | Admitting: Family Medicine

## 2022-12-13 VITALS — BP 118/74 | HR 90 | Temp 98.0°F | Resp 16 | Ht 63.0 in | Wt 162.4 lb

## 2022-12-13 DIAGNOSIS — E785 Hyperlipidemia, unspecified: Secondary | ICD-10-CM

## 2022-12-13 DIAGNOSIS — K8020 Calculus of gallbladder without cholecystitis without obstruction: Secondary | ICD-10-CM | POA: Diagnosis not present

## 2022-12-13 DIAGNOSIS — F418 Other specified anxiety disorders: Secondary | ICD-10-CM

## 2022-12-13 DIAGNOSIS — E559 Vitamin D deficiency, unspecified: Secondary | ICD-10-CM

## 2022-12-13 DIAGNOSIS — Z30431 Encounter for routine checking of intrauterine contraceptive device: Secondary | ICD-10-CM

## 2022-12-13 DIAGNOSIS — K219 Gastro-esophageal reflux disease without esophagitis: Secondary | ICD-10-CM

## 2022-12-13 DIAGNOSIS — H9190 Unspecified hearing loss, unspecified ear: Secondary | ICD-10-CM

## 2022-12-13 DIAGNOSIS — R1013 Epigastric pain: Secondary | ICD-10-CM

## 2022-12-13 NOTE — Assessment & Plan Note (Signed)
Patient presented to ED several months back with severe epigastric pain, work up showed gallstones but she has not proceeded with further evaluation secondary the pains are infrequent now. She notes pain was similar years ago when she had UGI confirmed gastritis. She is seeing her Gastroenterologist in [place] soon for a colonoscopy so will discuss with them as well and proceed with repeat UGI if that is negative she is encouraged to discuss removal of gallbladder with surgeon. Avoid fatty and spicy foods especially in the evenings

## 2022-12-13 NOTE — Patient Instructions (Signed)
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
## 2022-12-13 NOTE — Assessment & Plan Note (Signed)
Follows with Dr Teryl Lucy of OB and has had her Mirena 5-6 years now. Now her cycles are irregular and lighter. If she is concerned she is encouraged to follow up with OB but likely is a result of perimenopause

## 2022-12-13 NOTE — Assessment & Plan Note (Signed)
Avoid offending foods, start probiotics. Do not eat large meals in late evening and consider raising head of bed.  

## 2022-12-13 NOTE — Assessment & Plan Note (Signed)
Seen on imaging after epigastric pain encouraged to consider surgical consultation if pain continues to come and go after she has had an UGI (if it finds no concerns) minimize fatty, spicy foods

## 2022-12-14 ENCOUNTER — Other Ambulatory Visit: Payer: Self-pay | Admitting: Family Medicine

## 2022-12-14 MED ORDER — BENZONATATE 100 MG PO CAPS: 100.0000 mg | ORAL_CAPSULE | Freq: Three times a day (TID) | ORAL | 1 refills | Status: DC | PRN

## 2022-12-15 ENCOUNTER — Telehealth: Payer: Self-pay | Admitting: Family Medicine

## 2022-12-15 NOTE — Telephone Encounter (Signed)
[facility] called stating that they are out of network for pt's medicaid and will not be able to schedule her at their practice.

## 2022-12-16 ENCOUNTER — Other Ambulatory Visit: Payer: Self-pay | Admitting: Obstetrics and Gynecology

## 2022-12-16 DIAGNOSIS — Z1231 Encounter for screening mammogram for malignant neoplasm of breast: Secondary | ICD-10-CM

## 2022-12-19 ENCOUNTER — Other Ambulatory Visit: Payer: Self-pay

## 2022-12-19 DIAGNOSIS — Z1231 Encounter for screening mammogram for malignant neoplasm of breast: Secondary | ICD-10-CM

## 2023-01-16 ENCOUNTER — Encounter: Payer: Self-pay | Admitting: *Deleted

## 2023-01-26 ENCOUNTER — Ambulatory Visit
Admission: RE | Admit: 2023-01-26 | Discharge: 2023-01-26 | Disposition: A | Discharge status: Home / Self Care | Payer: 59 | Source: Ambulatory Visit

## 2023-01-26 DIAGNOSIS — Z1231 Encounter for screening mammogram for malignant neoplasm of breast: Secondary | ICD-10-CM

## 2023-03-06 ENCOUNTER — Encounter: Payer: Self-pay | Admitting: Family Medicine

## 2023-03-11 ENCOUNTER — Other Ambulatory Visit: Payer: Self-pay | Admitting: Family Medicine

## 2023-04-25 ENCOUNTER — Encounter: Payer: 59 | Admitting: Family Medicine

## 2023-09-11 ENCOUNTER — Other Ambulatory Visit: Payer: Self-pay | Admitting: Family Medicine

## 2023-10-02 DIAGNOSIS — N951 Menopausal and female climacteric states: Secondary | ICD-10-CM | POA: Insufficient documentation

## 2023-10-02 NOTE — Assessment & Plan Note (Signed)
Avoid offending foods, start probiotics. Do not eat large meals in late evening and consider raising head of bed.  

## 2023-10-02 NOTE — Assessment & Plan Note (Signed)
Discussed adequate hydration, rest, sleep and avoid simple carbs.

## 2023-10-02 NOTE — Assessment & Plan Note (Signed)
Supplement and monitor 

## 2023-10-02 NOTE — Assessment & Plan Note (Signed)
Encourage heart healthy diet such as MIND or DASH diet, increase exercise, avoid trans fats, simple carbohydrates and processed foods, consider a krill or fish or flaxseed oil cap daily.  °

## 2023-10-02 NOTE — Assessment & Plan Note (Signed)
Tolerating Wellbutrin but could consider SSRI to manage perimenopausal symptoms

## 2023-10-03 ENCOUNTER — Ambulatory Visit: Payer: 59 | Admitting: Family Medicine

## 2023-10-03 VITALS — BP 130/78 | HR 87 | Temp 98.6°F | Resp 18 | Ht 63.0 in | Wt 170.5 lb

## 2023-10-03 DIAGNOSIS — K219 Gastro-esophageal reflux disease without esophagitis: Secondary | ICD-10-CM | POA: Diagnosis not present

## 2023-10-03 DIAGNOSIS — E785 Hyperlipidemia, unspecified: Secondary | ICD-10-CM

## 2023-10-03 DIAGNOSIS — E559 Vitamin D deficiency, unspecified: Secondary | ICD-10-CM

## 2023-10-03 DIAGNOSIS — F418 Other specified anxiety disorders: Secondary | ICD-10-CM

## 2023-10-03 DIAGNOSIS — N951 Menopausal and female climacteric states: Secondary | ICD-10-CM | POA: Diagnosis not present

## 2023-10-03 DIAGNOSIS — Z23 Encounter for immunization: Secondary | ICD-10-CM | POA: Diagnosis not present

## 2023-10-03 LAB — CBC WITH DIFFERENTIAL/PLATELET
Basophils Absolute: 0.1 10*3/uL (ref 0.0–0.1)
Basophils Relative: 0.9 % (ref 0.0–3.0)
Eosinophils Absolute: 0 10*3/uL (ref 0.0–0.7)
Eosinophils Relative: 0.8 % (ref 0.0–5.0)
HCT: 41 % (ref 36.0–46.0)
Hemoglobin: 13.8 g/dL (ref 12.0–15.0)
Lymphocytes Relative: 29.8 % (ref 12.0–46.0)
Lymphs Abs: 1.8 10*3/uL (ref 0.7–4.0)
MCHC: 33.6 g/dL (ref 30.0–36.0)
MCV: 88.9 fL (ref 78.0–100.0)
Monocytes Absolute: 0.4 10*3/uL (ref 0.1–1.0)
Monocytes Relative: 6.3 % (ref 3.0–12.0)
Neutro Abs: 3.7 10*3/uL (ref 1.4–7.7)
Neutrophils Relative %: 62.2 % (ref 43.0–77.0)
Platelets: 208 10*3/uL (ref 150.0–400.0)
RBC: 4.62 Mil/uL (ref 3.87–5.11)
RDW: 12.6 % (ref 11.5–15.5)
WBC: 6 10*3/uL (ref 4.0–10.5)

## 2023-10-03 LAB — COMPREHENSIVE METABOLIC PANEL
ALT: 18 U/L (ref 0–35)
AST: 16 U/L (ref 0–37)
Albumin: 4.6 g/dL (ref 3.5–5.2)
Alkaline Phosphatase: 51 U/L (ref 39–117)
BUN: 16 mg/dL (ref 6–23)
CO2: 26 meq/L (ref 19–32)
Calcium: 8.8 mg/dL (ref 8.4–10.5)
Chloride: 102 meq/L (ref 96–112)
Creatinine, Ser: 0.7 mg/dL (ref 0.40–1.20)
GFR: 101.38 mL/min (ref >60.00)
Glucose, Bld: 104 mg/dL — ABNORMAL HIGH (ref 70–99)
Potassium: 4.2 meq/L (ref 3.5–5.1)
Sodium: 138 meq/L (ref 135–145)
Total Bilirubin: 0.5 mg/dL (ref 0.2–1.2)
Total Protein: 7.1 g/dL (ref 6.0–8.3)

## 2023-10-03 LAB — VITAMIN D 25 HYDROXY (VIT D DEFICIENCY, FRACTURES): VITD: 15.24 ng/mL — ABNORMAL LOW (ref 30.00–100.00)

## 2023-10-03 LAB — LIPID PANEL
Cholesterol: 228 mg/dL — ABNORMAL HIGH (ref 0–200)
HDL: 60.6 mg/dL (ref >39.00)
LDL Cholesterol: 153 mg/dL — ABNORMAL HIGH (ref 0–99)
NonHDL: 167.15
Total CHOL/HDL Ratio: 4
Triglycerides: 70 mg/dL (ref 0.0–149.0)
VLDL: 14 mg/dL (ref 0.0–40.0)

## 2023-10-03 LAB — TSH: TSH: 1.36 u[IU]/mL (ref 0.35–5.50)

## 2023-10-03 MED ORDER — PAROXETINE HCL 10 MG PO TABS
10.0000 mg | ORAL_TABLET | Freq: Every day | ORAL | 3 refills | Status: DC
Start: 2023-10-03 — End: 2023-10-25

## 2023-10-03 NOTE — Patient Instructions (Addendum)
 [employer]   Menopause Menopause is the normal time of a woman's life when menstrual periods stop completely. It marks the natural end to a woman's ability to become pregnant. It can be defined as the absence of a menstrual period for 12 months without another medical cause. The transition to menopause (perimenopause) most often happens between the ages of 46 and 27, and can last for many years. During perimenopause, hormone levels change in your body, which can cause symptoms and affect your health. Menopause may increase your risk for: Weakened bones (osteoporosis), which causes fractures. Depression. Hardening and narrowing of the arteries (atherosclerosis), which can cause heart attacks and strokes. What are the causes? This condition is usually caused by a natural change in hormone levels that happens as you get older. The condition may also be caused by changes that are not natural, including: Surgery to remove both ovaries (surgical menopause). Side effects from some medicines, such as chemotherapy used to treat cancer (chemical menopause). What increases the risk? This condition is more likely to start at an earlier age if you have certain medical conditions or have undergone treatments, including: A tumor of the pituitary gland in the brain. A disease that affects the ovaries and hormones. Certain cancer treatments, such as chemotherapy or hormone therapy, or radiation therapy on the pelvis. Heavy smoking and excessive alcohol use. Family history of early menopause. This condition is also more likely to develop earlier in women who are very thin. What are the signs or symptoms? Symptoms of this condition include: Hot flashes. Irregular menstrual periods. Night sweats. Changes in feelings about sex. This could be a decrease in sex drive or an increased discomfort around your sexuality. Vaginal dryness and thinning of the vaginal walls. This may  cause painful sex. Dryness of the skin and development of wrinkles. Headaches. Problems sleeping (insomnia). Mood swings or irritability. Memory problems. Weight gain. Hair growth on the face and chest. Bladder infections or problems with urinating. How is this diagnosed? This condition is diagnosed based on your medical history, a physical exam, your age, your menstrual history, and your symptoms. Hormone tests may also be done. How is this treated? In some cases, no treatment is needed. You and your health care provider should make a decision together about whether treatment is necessary. Treatment will be based on your individual condition and preferences. Treatment for this condition focuses on managing symptoms. Treatment may include: Menopausal hormone therapy (MHT). Medicines to treat specific symptoms or complications. Acupuncture. Vitamin or herbal supplements. Before starting treatment, make sure to let your health care provider know if you have a personal or family history of these conditions: Heart disease. Breast cancer. Blood clots. Diabetes. Osteoporosis. Follow these instructions at home: Lifestyle Do not use any products that contain nicotine or tobacco, such as cigarettes, e-cigarettes, and chewing tobacco. If you need help quitting, ask your health care provider. Get at least 30 minutes of physical activity on 5 or more days each week. Avoid alcoholic and caffeinated beverages, as well as spicy foods. This may help prevent hot flashes. Get 7-8 hours of sleep each night. If you have hot flashes, try: Dressing in layers. Avoiding things that may trigger hot flashes, such as spicy food, warm places, or stress. Taking slow, deep breaths when a hot flash starts. Keeping a fan in your home and office. Find ways to manage stress, such as deep breathing, meditation, or journaling. Consider going to group therapy  with other women who are having menopause symptoms. Ask  your health care provider about recommended group therapy meetings. Eating and drinking  Eat a healthy, balanced diet that contains whole grains, lean protein, low-fat dairy, and plenty of fruits and vegetables. Your health care provider may recommend adding more soy to your diet. Foods that contain soy include tofu, tempeh, and soy milk. Eat plenty of foods that contain calcium and vitamin D  for bone health. Items that are rich in calcium include low-fat milk, yogurt, beans, almonds, sardines, broccoli, and kale. Medicines Take over-the-counter and prescription medicines only as told by your health care provider. Talk with your health care provider before starting any herbal supplements. If prescribed, take vitamins and supplements as told by your health care provider. General instructions  Keep track of your menstrual periods, including: When they occur. How heavy they are and how long they last. How much time passes between periods. Keep track of your symptoms, noting when they start, how often you have them, and how long they last. Use vaginal lubricants or moisturizers to help with vaginal dryness and improve comfort during sex. Keep all follow-up visits. This is important. This includes any group therapy or counseling. Contact a health care provider if: You are still having menstrual periods after age 61. You have pain during sex. You have not had a period for 12 months and you develop vaginal bleeding. Get help right away if you have: Severe depression. Excessive vaginal bleeding. Pain when you urinate. A fast or irregular heartbeat (palpitations). Severe headaches. Abdominal pain or severe indigestion. Summary Menopause is a normal time of life when menstrual periods stop completely. It is usually defined as the absence of a menstrual period for 12 months without another medical cause. The transition to menopause (perimenopause) most often happens between the ages of 35 and 58  and can last for several years. Symptoms can be managed through medicines, lifestyle changes, and complementary therapies such as acupuncture. Eat a balanced diet that is rich in nutrients to promote bone health and heart health and to manage symptoms during menopause. This information is not intended to replace advice given to you by your health care provider. Make sure you discuss any questions you have with your health care provider. Document Revised: 06/19/2020 Document Reviewed: 03/05/2020 Elsevier Patient Education  2024 [employer].

## 2023-10-04 ENCOUNTER — Other Ambulatory Visit: Payer: Self-pay | Admitting: Family

## 2023-10-04 MED ORDER — VITAMIN D (ERGOCALCIFEROL) 1.25 MG (50000 UNIT) PO CAPS: 50000.0000 [IU] | ORAL_CAPSULE | ORAL | 0 refills | Status: AC

## 2023-10-08 ENCOUNTER — Encounter: Payer: Self-pay | Admitting: Family Medicine
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
## 2023-10-25 ENCOUNTER — Other Ambulatory Visit: Payer: Self-pay | Admitting: Family Medicine

## 2023-12-17 ENCOUNTER — Emergency Department (HOSPITAL_BASED_OUTPATIENT_CLINIC_OR_DEPARTMENT_OTHER)
Admission: EM | Admit: 2023-12-17 | Discharge: 2023-12-17 | Disposition: A | Discharge status: Home / Self Care | Attending: Emergency Medicine | Admitting: Emergency Medicine

## 2023-12-17 ENCOUNTER — Encounter (HOSPITAL_BASED_OUTPATIENT_CLINIC_OR_DEPARTMENT_OTHER): Payer: Self-pay

## 2023-12-17 ENCOUNTER — Emergency Department (HOSPITAL_BASED_OUTPATIENT_CLINIC_OR_DEPARTMENT_OTHER)

## 2023-12-17 ENCOUNTER — Other Ambulatory Visit: Payer: Self-pay

## 2023-12-17 DIAGNOSIS — K8020 Calculus of gallbladder without cholecystitis without obstruction: Secondary | ICD-10-CM | POA: Diagnosis not present

## 2023-12-17 DIAGNOSIS — R1011 Right upper quadrant pain: Secondary | ICD-10-CM | POA: Diagnosis present

## 2023-12-17 LAB — URINALYSIS, ROUTINE W REFLEX MICROSCOPIC
Bilirubin Urine: NEGATIVE
Glucose, UA: NEGATIVE mg/dL
Hgb urine dipstick: NEGATIVE
Ketones, ur: NEGATIVE mg/dL
Leukocytes,Ua: NEGATIVE
Nitrite: NEGATIVE
Protein, ur: NEGATIVE mg/dL
Specific Gravity, Urine: 1.015 (ref 1.005–1.030)
pH: 7 (ref 5.0–8.0)

## 2023-12-17 LAB — COMPREHENSIVE METABOLIC PANEL
ALT: 23 U/L (ref 0–44)
AST: 19 U/L (ref 15–41)
Albumin: 3.7 g/dL (ref 3.5–5.0)
Alkaline Phosphatase: 46 U/L (ref 38–126)
Anion gap: 6 (ref 5–15)
BUN: 13 mg/dL (ref 6–20)
CO2: 26 mmol/L (ref 22–32)
Calcium: 8.7 mg/dL — ABNORMAL LOW (ref 8.9–10.3)
Chloride: 105 mmol/L (ref 98–111)
Creatinine, Ser: 0.69 mg/dL (ref 0.44–1.00)
GFR, Estimated: >60 mL/min (ref >60)
Glucose, Bld: 126 mg/dL — ABNORMAL HIGH (ref 70–99)
Potassium: 4.7 mmol/L (ref 3.5–5.1)
Sodium: 137 mmol/L (ref 135–145)
Total Bilirubin: 0.5 mg/dL (ref 0.0–1.2)
Total Protein: 7 g/dL (ref 6.5–8.1)

## 2023-12-17 LAB — CBC
HCT: 39.4 % (ref 36.0–46.0)
Hemoglobin: 13.4 g/dL (ref 12.0–15.0)
MCH: 29.8 pg (ref 26.0–34.0)
MCHC: 34 g/dL (ref 30.0–36.0)
MCV: 87.6 fL (ref 80.0–100.0)
Platelets: 198 10*3/uL (ref 150–400)
RBC: 4.5 MIL/uL (ref 3.87–5.11)
RDW: 12.6 % (ref 11.5–15.5)
WBC: 7.2 10*3/uL (ref 4.0–10.5)
nRBC: 0 % (ref 0.0–0.2)

## 2023-12-17 LAB — PREGNANCY, URINE: Preg Test, Ur: NEGATIVE

## 2023-12-17 LAB — LIPASE, BLOOD: Lipase: 29 U/L (ref 11–51)

## 2023-12-17 MED ORDER — HYOSCYAMINE SULFATE 0.125 MG PO TABS: 0.1250 mg | ORAL_TABLET | ORAL | 0 refills | Status: DC | PRN

## 2023-12-17 NOTE — ED Triage Notes (Signed)
 Reports RUQ pain since 0200 today. BM "regular", nausea  States "everytime she eats junk food late at night this happens but this time feels worse"  Took "hyoscyamine" w some relief at home

## 2023-12-17 NOTE — ED Provider Notes (Signed)
 [place] [facility] AT [facility] Provider Note   CSN: [id number] Arrival date & time: 12/17/23  0908     History  Chief Complaint  Patient presents with   Abdominal Pain    [name] is a 48 y.o. female.  Patient with history of cesarean sections, no other abdominal surgeries, known history of cholelithiasis --presents to the emergency department for evaluation of right upper quadrant abdominal pain that started last night.  Patient has had upper abdominal pain in the past.  She reports having an upper endoscopy and had treatment for H. pylori.  She states that for a long time after this treatment, her pain did not recur.  She will have some minor pains every so often but last night the pain was severe.  She has had associated nausea.  She has had some loose nonbloody stools.  No chest pain or shortness of breath.  Pain radiates to the back.  No fevers.  No urinary symptoms.  Patient has tried over-the-counter stomach medications without much improvement.  She took one of her husbands hyoscyamine which actually helped somewhat.       Home Medications Prior to Admission medications   Medication Sig Start Date End Date Taking? Authorizing Provider  benzonatate (TESSALON PERLES) 100 MG capsule Take 1-2 capsules (100-200 mg total) by mouth 3 (three) times daily as needed for cough. 12/14/22   Bradd Canary, MD  levonorgestrel (MIRENA) 20 MCG/24HR IUD 1 Intra Uterine Device (1 each total) by Intrauterine route once for 1 dose. January 30, 2017 placed 10/05/17   Bradd Canary, MD  PARoxetine (PAXIL) 10 MG tablet TAKE 1 TABLET BY MOUTH EVERY DAY 10/25/23   Bradd Canary, MD  Vitamin D, Ergocalciferol, (DRISDOL) 1.25 MG (50000 UNIT) CAPS capsule Take 1 capsule (50,000 Units total) by mouth every 7 (seven) days. 10/04/23   Eulis Foster, FNP      Allergies    Patient has no known allergies.    Review of Systems   Review of Systems  Physical Exam Updated  Vital Signs BP (!) 138/121   Pulse 73   Resp 18   Ht 5\' 3"  (1.6 m)   Wt 72.6 kg   LMP 12/03/2023 (Approximate)   SpO2 99%   BMI 28.34 kg/m  Physical Exam Vitals and nursing note reviewed.  Constitutional:      General: She is not in acute distress.    Appearance: She is well-developed.  HENT:     Head: Normocephalic and atraumatic.     Right Ear: External ear normal.     Left Ear: External ear normal.     Nose: Nose normal.  Eyes:     Conjunctiva/sclera: Conjunctivae normal.  Cardiovascular:     Rate and Rhythm: Normal rate and regular rhythm.     Heart sounds: No murmur heard. Pulmonary:     Effort: No respiratory distress.     Breath sounds: No wheezing, rhonchi or rales.  Abdominal:     Palpations: Abdomen is soft.     Tenderness: There is abdominal tenderness in the right upper quadrant and epigastric area. There is no guarding or rebound. Negative signs include Rovsing's sign, McBurney's sign and psoas sign.  Musculoskeletal:     Cervical back: Normal range of motion and neck supple.     Right lower leg: No edema.     Left lower leg: No edema.  Skin:    General: Skin is warm and dry.  Findings: No rash.  Neurological:     General: No focal deficit present.     Mental Status: She is alert. Mental status is at baseline.     Motor: No weakness.  Psychiatric:        Mood and Affect: Mood normal.     ED Results / Procedures / Treatments   Labs (all labs ordered are listed, but only abnormal results are displayed) Labs Reviewed  LIPASE, BLOOD  COMPREHENSIVE METABOLIC PANEL  CBC  URINALYSIS, ROUTINE W REFLEX MICROSCOPIC  PREGNANCY, URINE    EKG None  Radiology No results found.  Procedures Procedures    Medications Ordered in ED Medications - No data to display  ED Course/ Medical Decision Making/ A&P    Patient seen and examined. History obtained directly from patient.   Labs/EKG: Ordered CBC, CMP, lipase, UA, pregnancy.  Imaging:  Ordered right upper quadrant ultrasound.  Medications/Fluids: Offered pain/nausea medication, patient declines  Most recent vital signs reviewed and are as follows: BP (!) 138/121   Pulse 73   Resp 18   Ht 5\' 3"  (1.6 m)   Wt 72.6 kg   LMP 12/03/2023 (Approximate)   SpO2 99%   BMI 28.34 kg/m   Initial impression: Right upper quadrant abdominal pain  10:30 AM Reassessment performed. Patient appears comfortable.  Pain is controlled.  Labs personally reviewed and interpreted including: CBC unremarkable; CMP glucose 126 otherwise unremarkable; lipase normal.  UA negative, pregnancy negative.  Imaging personally visualized and interpreted including: Ultrasound, agree with gallstones, no pericholecystic fluid or gallbladder wall thickening.  Reviewed pertinent lab work and imaging with patient at bedside. Questions answered.   Most current vital signs reviewed and are as follows: BP (!) 138/121   Pulse 73   Temp 98.3 F (36.8 C) (Oral)   Resp 18   Ht 5\' 3"  (1.6 m)   Wt 72.6 kg   LMP 12/03/2023 (Approximate)   SpO2 99%   BMI 28.34 kg/m   Plan: Discharge to home.  Patient is comfortable with going home, requests prescription for hyoscyamine given that this did seem to help her.  She is also comfortable with using Tylenol or ibuprofen for pain.  We did discuss surgical follow-up.  She has a preference not to have surgery if possible.  She will follow-up with her primary care doctor as needed.  Prescriptions written for: Hyoscyamine  Other home care instructions discussed: Avoidance of foods that make symptoms worse  ED return instructions discussed: The patient was urged to return to the Emergency Department immediately with worsening of current symptoms, worsening abdominal pain, persistent vomiting, blood noted in stools, fever, or any other concerns. The patient verbalized understanding.   Follow-up instructions discussed: Patient encouraged to follow-up with their PCP in 7  days.                                 Medical Decision Making Amount and/or Complexity of Data Reviewed Labs: ordered. Radiology: ordered.  Risk Prescription drug management.   For this patient's complaint of abdominal pain, the following conditions were considered on the differential diagnosis: gastritis/PUD, enteritis/duodenitis, appendicitis, cholelithiasis/cholecystitis, cholangitis, pancreatitis, ruptured viscus, colitis, diverticulitis, small/large bowel obstruction, proctitis, cystitis, pyelonephritis, ureteral colic, aortic dissection, aortic aneurysm. In women, ectopic pregnancy, pelvic inflammatory disease, ovarian cysts, and tubo-ovarian abscess were also considered. Atypical chest etiologies were also considered including ACS, PE, and pneumonia.  Clinically symptoms are consistent with cholelithiasis.  Lab workup is reassuring.  Transaminases and lipase are normal.  Do not suspect choledocholithiasis.  Possible early acute cholecystitis, but patient symptoms are fairly controlled.  She would like to continue to attempt symptom control at home.  I feel that this is reasonable.  The patient's vital signs, pertinent lab work and imaging were reviewed and interpreted as discussed in the ED course. Hospitalization was considered for further testing, treatments, or serial exams/observation. However as patient is well-appearing, has a stable exam, and reassuring studies today, I do not feel that they warrant admission at this time. This plan was discussed with the patient who verbalizes agreement and comfort with this plan and seems reliable and able to return to the Emergency Department with worsening or changing symptoms.          Final Clinical Impression(s) / ED Diagnoses Final diagnoses:  Symptomatic cholelithiasis    Rx / DC Orders ED Discharge Orders          Ordered    hyoscyamine (LEVSIN) 0.125 MG tablet  Every 4 hours PRN        12/17/23 1028               Renne Crigler, PA-C 12/17/23 1034    Anders Simmonds T, DO 12/19/23 [phone]

## 2023-12-17 NOTE — ED Notes (Signed)
 Pt drinking tea she brought with her

## 2023-12-17 NOTE — Discharge Instructions (Signed)
 Please read and follow all provided instructions.  Your diagnoses today include:  1. Symptomatic cholelithiasis     Tests performed today include: Blood cell counts and platelets: Normal infection fighting cell counts Kidney and liver function tests: Normal liver function test Pancreas function test (called lipase): Normal Urine test to look for infection A blood or urine test for pregnancy (women only) Ultrasound shows gallstones and a distended gallbladder but no hard signs of an infected gallbladder Vital signs. See below for your results today.   Medications prescribed:  Hyoscyamine: Medication for gallbladder spasm  Take any prescribed medications only as directed.  Home care instructions:  Follow any educational materials contained in this packet.  Follow-up instructions: Please follow-up with your primary care provider in the next 7 days for further evaluation of your symptoms.    Return instructions:  SEEK IMMEDIATE MEDICAL ATTENTION IF: The pain does not go away or becomes severe  A temperature above 101F develops  Repeated vomiting occurs (multiple episodes)  The pain becomes localized to portions of the abdomen. The right side could possibly be appendicitis. In an adult, the left lower portion of the abdomen could be colitis or diverticulitis.  Blood is being passed in stools or vomit (bright red or black tarry stools)  You develop chest pain, difficulty breathing, dizziness or fainting, or become confused, poorly responsive, or inconsolable (young children) If you have any other emergent concerns regarding your health  Additional Information: Abdominal (belly) pain can be caused by many things. Your caregiver performed an examination and possibly ordered blood/urine tests and imaging (CT scan, x-rays, ultrasound). Many cases can be observed and treated at home after initial evaluation in the emergency department. Even though you are being discharged home, abdominal  pain can be unpredictable. Therefore, you need a repeated exam if your pain does not resolve, returns, or worsens. Most patients with abdominal pain don't have to be admitted to the hospital or have surgery, but serious problems like appendicitis and gallbladder attacks can start out as nonspecific pain. Many abdominal conditions cannot be diagnosed in one visit, so follow-up evaluations are very important.  Your vital signs today were: BP (!) 138/121   Pulse 73   Temp 98.3 F (36.8 C) (Oral)   Resp 18   Ht 5\' 3"  (1.6 m)   Wt 72.6 kg   LMP 12/03/2023 (Approximate)   SpO2 99%   BMI 28.34 kg/m  If your blood pressure (bp) was elevated above 135/85 this visit, please have this repeated by your doctor within one month. --------------

## 2024-01-02 ENCOUNTER — Telehealth: Payer: 59 | Admitting: Family Medicine

## 2024-01-10 IMAGING — MG MM DIGITAL SCREENING BILAT W/ TOMO AND CAD
8 series · 9 of 24 positions shown · non-contrast
Comparison: Previous exam(s).

CLINICAL DATA: Screening.

EXAM:
DIGITAL SCREENING BILATERAL MAMMOGRAM WITH TOMOSYNTHESIS AND CAD
TECHNIQUE: Bilateral screening digital craniocaudal and mediolateral oblique
mammograms were obtained. Bilateral screening digital breast
tomosynthesis was performed. The images were evaluated with
computer-aided detection.

[L MLO synth-2D]
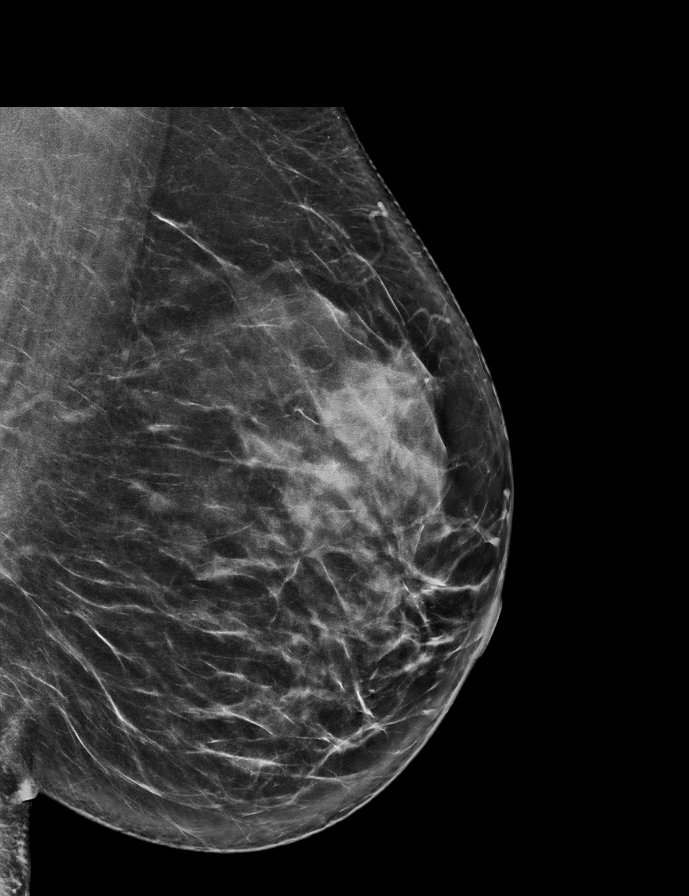

[R MLO synth-2D]
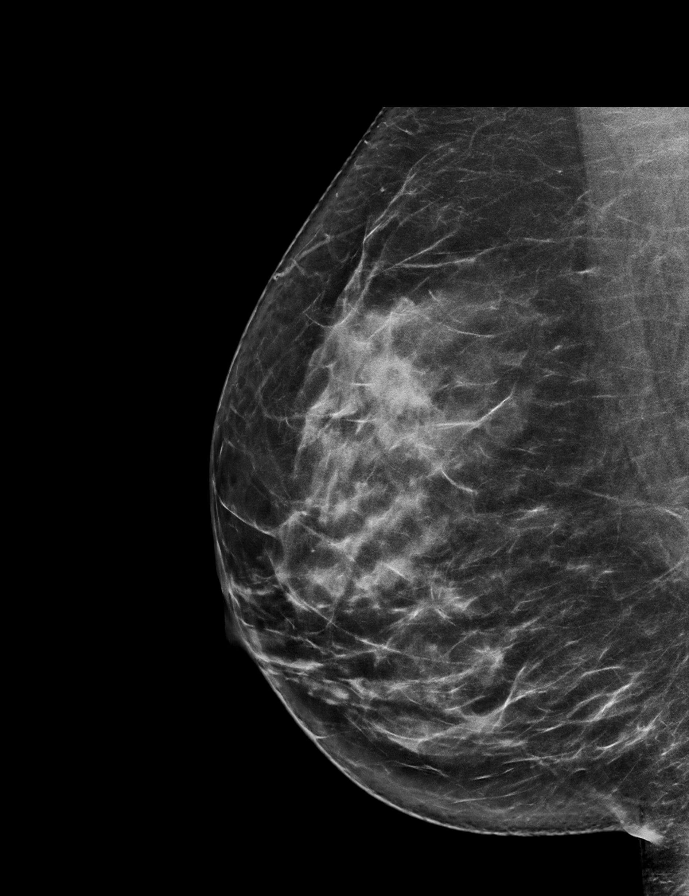

[R CC synth-2D]
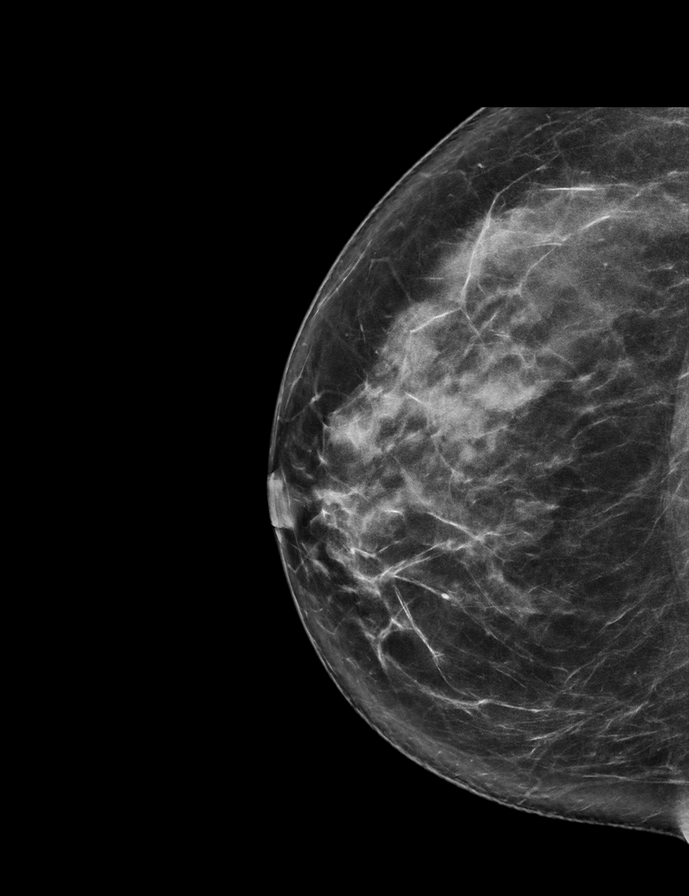

[L CC synth-2D]
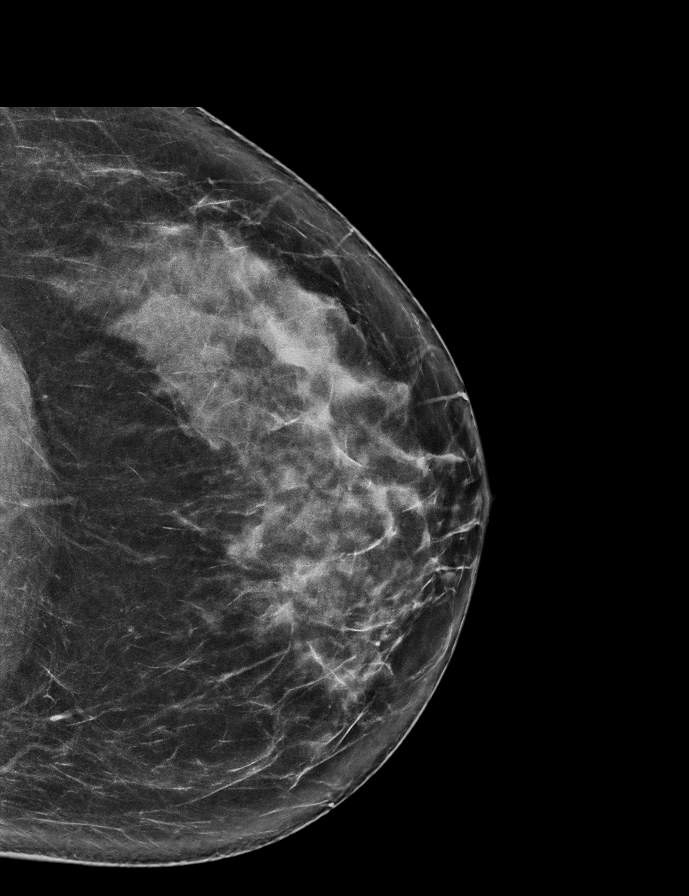

[L CC tomo · 2 of 72 frames shown]
[frame 24/72]
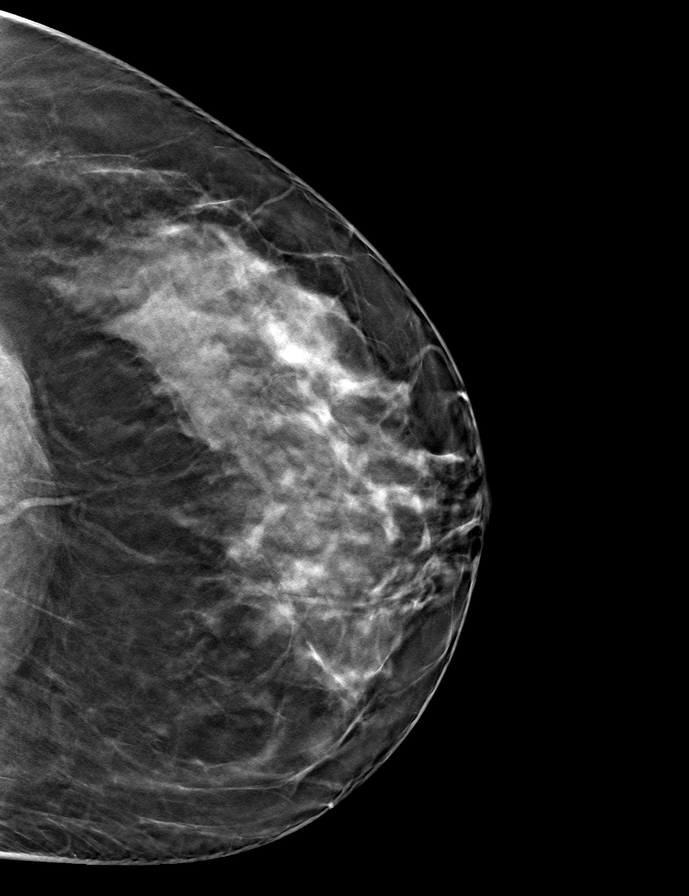
[frame 37/72]
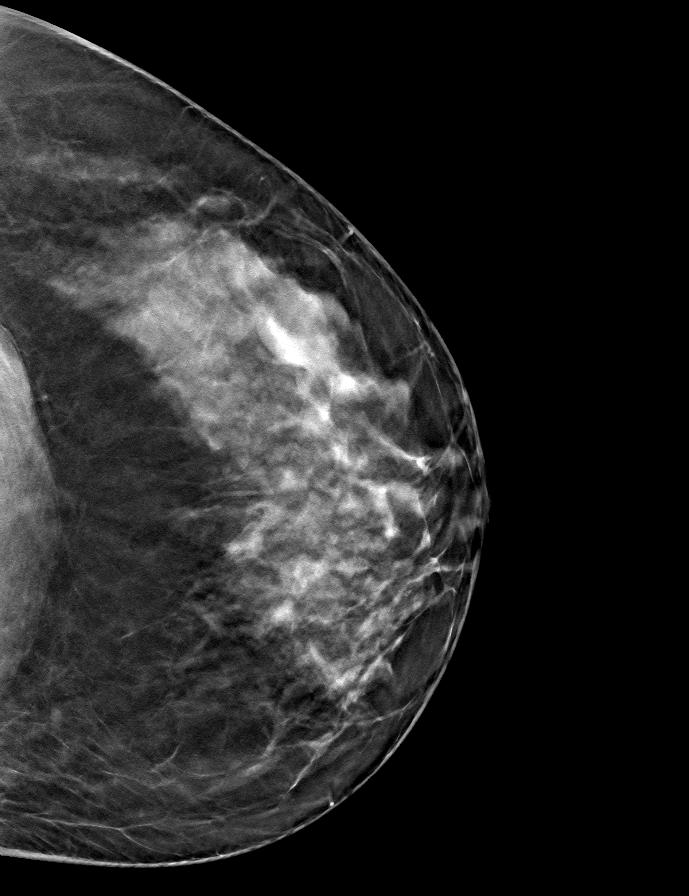

[L MLO tomo · tomo slice 39/78.0]
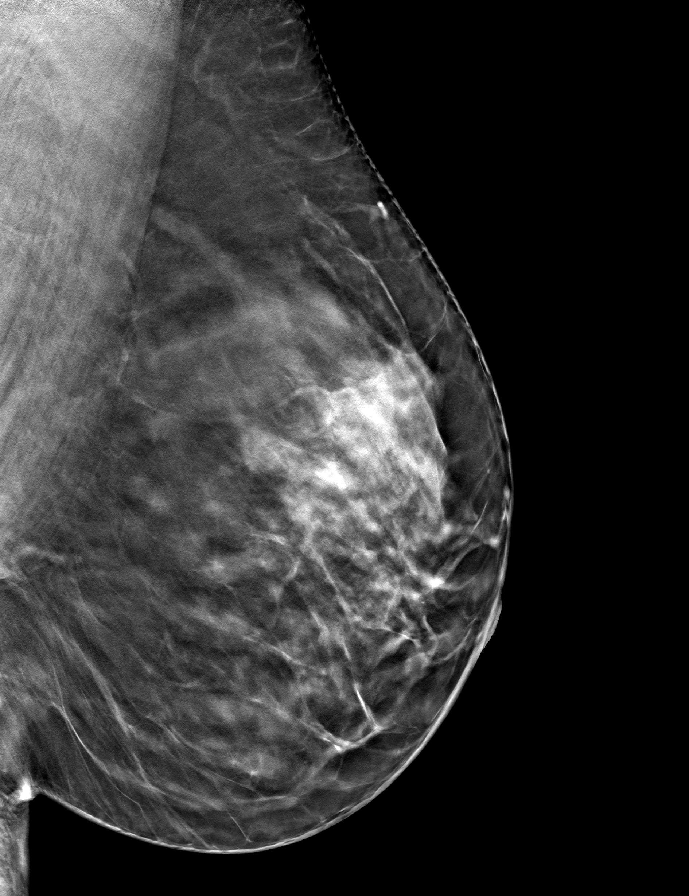

[R MLO tomo · tomo slice 39/76.0]
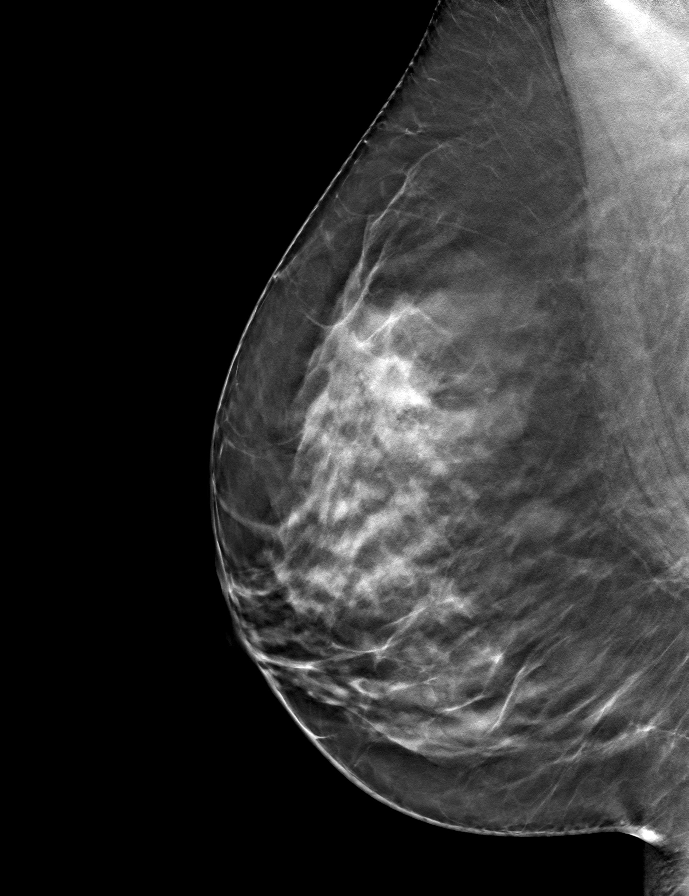

[R CC tomo · tomo slice 37/73.0]
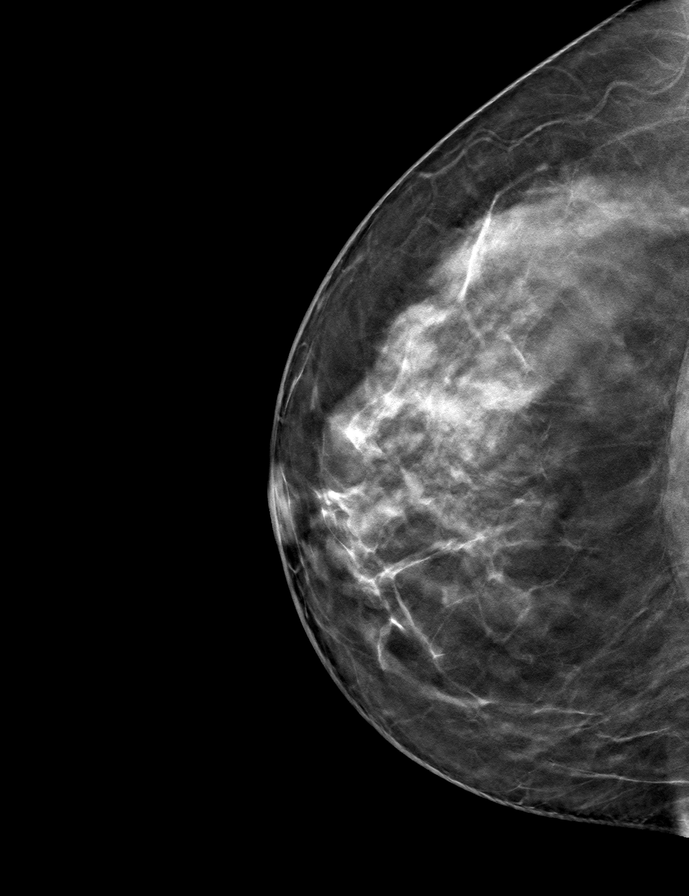

[9 of 24 positions shown; findings below may reference images not displayed]

ACR Breast Density Category c: The breast tissue is heterogeneously
dense, which may obscure small masses.
FINDINGS: In the left breast, a possible mass warrants further evaluation. In
the right breast, no findings suspicious for malignancy.
IMPRESSION: Further evaluation is suggested for possible mass in the left
breast.

RECOMMENDATION:
Ultrasound of the left breast. (Code:[id number])

The patient will be contacted regarding the findings, and additional
imaging will be scheduled.

BI-RADS CATEGORY  0: Incomplete. Need additional imaging evaluation
and/or prior mammograms for comparison.

## 2024-01-11 ENCOUNTER — Ambulatory Visit: Payer: 59 | Admitting: Family Medicine

## 2024-01-16 NOTE — Telephone Encounter (Signed)
 Patient is calling back, not sure if she needs a new referral. States she was told because she was a patient she wouldn't need one. Please advise.  Telephone: [phone]

## 2024-04-18 ENCOUNTER — Encounter: Payer: 59 | Admitting: Family Medicine

## 2024-05-03 ENCOUNTER — Other Ambulatory Visit: Payer: Self-pay

## 2024-05-03 ENCOUNTER — Emergency Department (HOSPITAL_BASED_OUTPATIENT_CLINIC_OR_DEPARTMENT_OTHER)

## 2024-05-03 ENCOUNTER — Emergency Department (HOSPITAL_BASED_OUTPATIENT_CLINIC_OR_DEPARTMENT_OTHER)
Admission: EM | Admit: 2024-05-03 | Discharge: 2024-05-03 | Disposition: A | Discharge status: Home / Self Care | Source: Ambulatory Visit | Attending: Emergency Medicine | Admitting: Emergency Medicine

## 2024-05-03 ENCOUNTER — Encounter (HOSPITAL_BASED_OUTPATIENT_CLINIC_OR_DEPARTMENT_OTHER): Payer: Self-pay | Admitting: Emergency Medicine

## 2024-05-03 DIAGNOSIS — R002 Palpitations: Secondary | ICD-10-CM | POA: Insufficient documentation

## 2024-05-03 DIAGNOSIS — E039 Hypothyroidism, unspecified: Secondary | ICD-10-CM | POA: Insufficient documentation

## 2024-05-03 DIAGNOSIS — R519 Headache, unspecified: Secondary | ICD-10-CM | POA: Diagnosis not present

## 2024-05-03 LAB — CBC
HCT: 39.4 % (ref 36.0–46.0)
Hemoglobin: 13.6 g/dL (ref 12.0–15.0)
MCH: 30 pg (ref 26.0–34.0)
MCHC: 34.5 g/dL (ref 30.0–36.0)
MCV: 86.8 fL (ref 80.0–100.0)
Platelets: 215 K/uL (ref 150–400)
RBC: 4.54 MIL/uL (ref 3.87–5.11)
RDW: 12.2 % (ref 11.5–15.5)
WBC: 8 K/uL (ref 4.0–10.5)
nRBC: 0 % (ref 0.0–0.2)

## 2024-05-03 LAB — TROPONIN T, HIGH SENSITIVITY: Troponin T High Sensitivity: <15 ng/L (ref <19)

## 2024-05-03 LAB — BASIC METABOLIC PANEL WITH GFR
Anion gap: 11 (ref 5–15)
BUN: 15 mg/dL (ref 6–20)
CO2: 26 mmol/L (ref 22–32)
Calcium: 9.5 mg/dL (ref 8.9–10.3)
Chloride: 103 mmol/L (ref 98–111)
Creatinine, Ser: 0.84 mg/dL (ref 0.44–1.00)
GFR, Estimated: >60 mL/min (ref >60)
Glucose, Bld: 98 mg/dL (ref 70–99)
Potassium: 4.4 mmol/L (ref 3.5–5.1)
Sodium: 139 mmol/L (ref 135–145)

## 2024-05-03 LAB — PREGNANCY, URINE: Preg Test, Ur: NEGATIVE

## 2024-05-03 NOTE — Discharge Instructions (Addendum)
 You were evaluated in the emergency room for palpitations.  Your lab work and imaging did not show any significant abnormality.  You are provided a referral for cardiology.  You may also follow-up with your primary care doctor.

## 2024-05-03 NOTE — ED Triage Notes (Signed)
 Patient c/o chest pressure, heart palpations, bruising, and headache x 2 weeks. Sx are intermittent. [facility] here from [facility] due to abnormal EKG Stopped prozac 3 days ago. Belching improves the chest pressure.
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
## 2024-07-10 ENCOUNTER — Ambulatory Visit: Payer: Self-pay

## 2024-07-10 NOTE — Telephone Encounter (Signed)
 FYI Only or Action Required?: FYI only for provider.  Patient was last seen in primary care on 10/03/2023 by Nancy Harlene LABOR, MD.  Called Nurse Triage reporting Palpitations.  Symptoms began several weeks ago.  Interventions attempted: Rest, hydration, or home remedies.  Symptoms are: unchanged.  Triage Disposition: See PCP Within 2 Weeks  Patient/caregiver understands and will follow disposition?: Yes  Copied from CRM [id number]. Topic: Clinical - Red Word Triage >> Jul 10, 2024 10:50 AM [place] J wrote: [facility] that prompted transfer to Nurse Triage: Patient has been having a feeling of an adrenaline rush in her chest. She is needing an acute appointment as soon as possible. The previous appointment she had scheduled was an office visit with a provider other than her PCP. Because of this, it was cancelled. Reason for Disposition  Palpitations are a chronic symptom (recurrent or ongoing AND present > 4 weeks)  Answer Assessment - Initial Assessment Questions Pt states she has been having adrenaline rushes for about 6 weeks now in her chest. Seen at ED 3-4 weeks ago and cleared for MI or emergent issues. Recommended f/u w/ cardio. Pt had app scheduled for tomorrow, but it was cancelled. RN assisted pt to rebook for acute visit. NAD   1. DESCRIPTION: Please describe your heart rate or heartbeat that you are having (e.g., fast/slow, regular/irregular, skipped or extra beats, palpitations)     Feels like an adrenaline rush  2. ONSET: When did it start? (e.g., minutes, hours, days)      6 weeks ago 3. DURATION: How long does it last (e.g., seconds, minutes, hours)     minutes 4. PATTERN Does it come and go, or has it been constant since it started?  Does it get worse with exertion?   Are you feeling it now?     intermittent 5. TAP: Using your hand, can you tap out what you are feeling on a chair or table in front of you, so that I can hear? Note: Not all patients can do  this.       denies 6. HEART RATE: Can you tell me your heart rate? How many beats in 15 seconds?  Note: Not all patients can do this.       denies 7. RECURRENT SYMPTOM: Have you ever had this before? If Yes, ask: When was the last time? and What happened that time?      Recurrent, unsure, being evaluated 8. CAUSE: What do you think is causing the palpitations?     unsure 9. CARDIAC HISTORY: Do you have any history of heart disease? (e.g., heart attack, angina, bypass surgery, angioplasty, arrhythmia)      denies 10. OTHER SYMPTOMS: Do you have any other symptoms? (e.g., dizziness, chest pain, sweating, difficulty breathing)       Occasional dizziness; thinks vertigo  Protocols used: Heart Rate and Heartbeat Questions-A-AH

## 2024-07-11 ENCOUNTER — Ambulatory Visit: Admitting: Family Medicine

## 2024-07-11 ENCOUNTER — Encounter: Payer: Self-pay | Admitting: Family Medicine

## 2024-07-11 ENCOUNTER — Ambulatory Visit (INDEPENDENT_AMBULATORY_CARE_PROVIDER_SITE_OTHER): Admitting: Family Medicine

## 2024-07-11 ENCOUNTER — Ambulatory Visit: Attending: Family Medicine

## 2024-07-11 VITALS — BP 117/87 | HR 86 | Ht 64.0 in | Wt 175.0 lb

## 2024-07-11 DIAGNOSIS — R002 Palpitations: Secondary | ICD-10-CM | POA: Diagnosis not present

## 2024-07-11 DIAGNOSIS — R42 Dizziness and giddiness: Secondary | ICD-10-CM

## 2024-07-11 DIAGNOSIS — G4739 Other sleep apnea: Secondary | ICD-10-CM

## 2024-07-11 LAB — BASIC METABOLIC PANEL WITH GFR
BUN: 14 mg/dL (ref 6–23)
CO2: 30 meq/L (ref 19–32)
Calcium: 9.3 mg/dL (ref 8.4–10.5)
Chloride: 102 meq/L (ref 96–112)
Creatinine, Ser: 0.79 mg/dL (ref 0.40–1.20)
GFR: 87.21 mL/min (ref >60.00)
Glucose, Bld: 94 mg/dL (ref 70–99)
Potassium: 4.7 meq/L (ref 3.5–5.1)
Sodium: 138 meq/L (ref 135–145)

## 2024-07-11 LAB — B12 AND FOLATE PANEL
Folate: 7.8 ng/mL (ref >5.9)
Vitamin B-12: 562 pg/mL (ref 211–911)

## 2024-07-11 LAB — EKG 12-LEAD

## 2024-07-11 LAB — TSH: TSH: 1.64 u[IU]/mL (ref 0.35–5.50)

## 2024-07-11 NOTE — Progress Notes (Signed)
 Acute Office Visit  Subjective:     Patient ID: [name], female    DOB: [birth year], 48 y.o.   MRN: [id number]  Chief Complaint  Patient presents with   Palpitations    HPI Patient is in today for palpitations, dizziness, snoring.   Discussed the use of AI scribe software for clinical note transcription with the patient, who gave verbal consent to proceed.  History of Present Illness [name] is a 48 year old female who presents with palpitations and dizziness.  She experiences palpitations described as a 'flutter' or 'adrenaline' feeling, primarily occurring in the afternoons and evenings. These episodes can last for hours and typically occur daily. She has felt this sensation rarely for years, and has noticed it often occurs with position changes, but notes a recent increase in frequency and intensity. She visited emergency care in August due to concern over these symptoms, where an EKG showed abnormalities, and she was referred to cardiology- states she never heard from them. No shortness of breath or clamminess during the palpitations, but she does report a pressure sensation and occasional dizziness. Her current caffeine intake is limited to 1 cup of coffee in the morning.  She also experiences dizziness, described as feeling 'woozy' or 'off balance,' sometimes exacerbated by head movements. This has been ongoing for about two weeks. She has a family history of vertigo, and she attempted a maneuver to dislodge ear crystals.  Additionally, she has started snoring heavily in the past month, which has disrupted her husband's sleep. She has not been told she stops breathing during sleep but occasionally wakes up suddenly, possibly gasping for air. She has gained twenty pounds in the last four months, which she attributes to starting fluoxetine, a medication she has taken in the past with similar weight gain effects.  Her past medical history includes a long-standing issue  with positional dizziness, and she has a family history of vertigo. She is following with [facility] and started semaglutide four weeks ago, with no noticeable effects yet.         ROS All review of systems negative except what is listed in the HPI      Objective:    BP 117/87   Pulse 86   Ht 5' 4 (1.626 m)   Wt 175 lb (79.4 kg)   SpO2 100%   BMI 30.04 kg/m    Physical Exam Vitals reviewed.  Constitutional:      Appearance: Normal appearance.  HENT:     Head:     Comments: Negative Dix Hallpike    Right Ear: Tympanic membrane normal.     Left Ear: Tympanic membrane normal.  Cardiovascular:     Rate and Rhythm: Normal rate and regular rhythm.     Heart sounds: Normal heart sounds.     Comments: occasional early beat Pulmonary:     Effort: Pulmonary effort is normal.     Breath sounds: Normal breath sounds.  Musculoskeletal:     Cervical back: Normal range of motion and neck supple.  Skin:    General: Skin is warm and dry.  Neurological:     Mental Status: She is alert and oriented to person, place, and time.  Psychiatric:        Mood and Affect: Mood normal.        Behavior: Behavior normal.        Thought Content: Thought content normal.        Judgment: Judgment normal.  Results for orders placed or performed in visit on 07/11/24  EKG 12-Lead  Result Value Ref Range   EKG 12 lead          Assessment & Plan:   Problem List Items Addressed This Visit   None Visit Diagnoses       Palpitations    -  Primary   Relevant Orders   TSH   B12 and Folate Panel   Ambulatory referral to Cardiology   Basic metabolic panel with GFR   LONG TERM MONITOR (3-14 DAYS)   EKG 12-Lead (Completed)     Dizziness       Relevant Orders   Ambulatory referral to Cardiology   Basic metabolic panel with GFR   LONG TERM MONITOR (3-14 DAYS)   EKG 12-Lead (Completed)     Sleep apnea-like behavior       Relevant Orders   Ambulatory referral to  Pulmonology       Assessment & Plan Palpitations Fluttering sensation daily, more in afternoons/evenings. Differential includes arrhythmia or anxiety. Previous EKG abnormalities noted. Cardiology referral pending. - Order new EKG = SR 81 bpm, non-specific T-wave abnormality, possible old infarct, no ST elevation or ectopic beats - Send cardiology referral to main campus. - Order heart monitor for two weeks. - Check thyroid  function and B12 levels.  Dizziness Woozy, off-balance sensation. - Negative Dix Hallpike, normal ear exam today. - Recommend Flonase  nasal spray, two sprays in each nostril daily. - Continue cardiac workup.  Snoring, Sleep apnea like symptoms Recent loud snoring, possible sleep apnea with weight gain and occasional gasping. Flonase  recommended for sinus congestion. - Recommend Flonase  nasal spray, two sprays in each nostril daily  - Refer to pulmonology for sleep study   Patient aware of signs/symptoms requiring further/urgent evaluation.    No orders of the defined types were placed in this encounter.   Return for - pending results or sooner if needed.  Waddell KATHEE Mon, NP

## 2024-07-11 NOTE — Patient Instructions (Addendum)
 SABRA

## 2024-07-11 NOTE — Progress Notes (Unsigned)
 EP to read.

## 2024-07-12 ENCOUNTER — Ambulatory Visit: Payer: Self-pay | Admitting: Family Medicine

## 2024-07-12 DIAGNOSIS — Z1231 Encounter for screening mammogram for malignant neoplasm of breast: Secondary | ICD-10-CM

## 2024-07-19 ENCOUNTER — Encounter: Payer: Self-pay | Admitting: Nurse Practitioner

## 2024-07-19 ENCOUNTER — Ambulatory Visit: Attending: Internal Medicine | Admitting: Nurse Practitioner

## 2024-07-19 VITALS — BP 122/88 | HR 97 | Ht 63.0 in | Wt 170.2 lb

## 2024-07-19 DIAGNOSIS — R002 Palpitations: Secondary | ICD-10-CM | POA: Insufficient documentation

## 2024-07-19 DIAGNOSIS — Z87898 Personal history of other specified conditions: Secondary | ICD-10-CM | POA: Insufficient documentation

## 2024-07-19 DIAGNOSIS — R42 Dizziness and giddiness: Secondary | ICD-10-CM | POA: Insufficient documentation

## 2024-07-19 DIAGNOSIS — R9431 Abnormal electrocardiogram [ECG] [EKG]: Secondary | ICD-10-CM | POA: Diagnosis present

## 2024-07-19 NOTE — Patient Instructions (Signed)
 Medication Instructions:  Your physician recommends that you continue on your current medications as directed. Please refer to the Current Medication list given to you today.  *If you need a refill on your cardiac medications before your next appointment, please call your pharmacy*  Lab Work: None ordered If you have labs (blood work) drawn today and your tests are completely normal, you will receive your results only by: MyChart Message (if you have MyChart) OR A paper copy in the mail If you have any lab test that is abnormal or we need to change your treatment, we will call you to review the results.  Testing/Procedures: Your provider had ordered an ECHOCARDIOGRAM:  Your physician has requested that you have an echocardiogram. Echocardiography is a painless test that uses sound waves to create images of your heart. It provides your doctor with information about the size and shape of your heart and how well your heart's chambers and valves are working. This procedure takes approximately one hour. There are no restrictions for this procedure. Please do NOT wear cologne, perfume, aftershave, or lotions (deodorant is allowed). Please arrive 15 minutes prior to your appointment time.  Please note: We ask at that you not bring children with you during ultrasound (echo/ vascular) testing. Due to room size and safety concerns, children are not allowed in the ultrasound rooms during exams. Our front office staff cannot provide observation of children in our lobby area while testing is being conducted. An adult accompanying a patient to their appointment will only be allowed in the ultrasound room at the discretion of the ultrasound technician under special circumstances. We apologize for any inconvenience.   Follow-Up: At [facility], you and your health needs are our priority.  As part of our continuing mission to provide you with exceptional heart care, our providers are all part of one  team.  This team includes your primary Cardiologist (physician) and Advanced Practice Providers or APPs (Physician Assistants and Nurse Practitioners) who all work together to provide you with the care you need, when you need it.  Your next appointment:   6 week(s)  Provider:   Georganna Archer, MD or Damien Braver, NP   We recommend signing up for the patient portal called MyChart.  Sign up information is provided on this After Visit Summary.  MyChart is used to connect with patients for Virtual Visits (Telemedicine).  Patients are able to view lab/test results, encounter notes, upcoming appointments, etc.  Non-urgent messages can be sent to your provider as well.   To learn more about what you can do with MyChart, go to [email or web address].
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
## 2024-07-23 ENCOUNTER — Encounter: Payer: Self-pay | Admitting: Nurse Practitioner

## 2024-07-24 ENCOUNTER — Other Ambulatory Visit: Payer: Self-pay | Admitting: Family Medicine

## 2024-07-24 DIAGNOSIS — Z1231 Encounter for screening mammogram for malignant neoplasm of breast: Secondary | ICD-10-CM

## 2024-07-24 NOTE — Addendum Note (Signed)
 Addended by: Hye Trawick L on: 07/24/2024 01:27 PM   Modules accepted: Orders

## 2024-07-24 NOTE — Telephone Encounter (Signed)
 No record of medication in her historical meds or current medication list.   Mgm not ordered. Please advise on order.

## 2024-07-26 MED ORDER — FAMOTIDINE 40 MG PO TABS: 40.0000 mg | ORAL_TABLET | Freq: Every day | ORAL | 0 refills | Status: AC

## 2024-07-26 NOTE — Addendum Note (Signed)
 Addended by: Latoia Eyster L on: 07/26/2024 08:05 AM   Modules accepted: Orders

## 2024-08-08 ENCOUNTER — Encounter

## 2024-08-08 DIAGNOSIS — R002 Palpitations: Secondary | ICD-10-CM | POA: Diagnosis not present

## 2024-08-08 DIAGNOSIS — Z1231 Encounter for screening mammogram for malignant neoplasm of breast: Secondary | ICD-10-CM

## 2024-08-08 DIAGNOSIS — R42 Dizziness and giddiness: Secondary | ICD-10-CM | POA: Diagnosis not present

## 2024-08-23 ENCOUNTER — Ambulatory Visit (HOSPITAL_COMMUNITY)
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
## 2024-09-03 ENCOUNTER — Ambulatory Visit
Attending: Student in an Organized Health Care Education/Training Program | Admitting: Student in an Organized Health Care Education/Training Program

## 2024-10-07 ENCOUNTER — Ambulatory Visit: Admitting: Family Medicine

## 2024-10-11 ENCOUNTER — Encounter: Payer: Self-pay | Admitting: Nurse Practitioner
